# Patient Record
Sex: Female | Born: 1970 | Race: Black or African American | Hispanic: No | Marital: Married | Smoking: Never smoker
Health system: Southern US, Community
[De-identification: ages and names within clinical notes are randomized; demographics above are authoritative.]

## PROBLEM LIST (undated history)

## (undated) DIAGNOSIS — N8003 Adenomyosis of the uterus: Secondary | ICD-10-CM

## (undated) DIAGNOSIS — J309 Allergic rhinitis, unspecified: Secondary | ICD-10-CM

## (undated) DIAGNOSIS — R002 Palpitations: Secondary | ICD-10-CM

## (undated) DIAGNOSIS — Z8619 Personal history of other infectious and parasitic diseases: Secondary | ICD-10-CM

## (undated) DIAGNOSIS — D649 Anemia, unspecified: Secondary | ICD-10-CM

## (undated) DIAGNOSIS — N800 Endometriosis of uterus: Secondary | ICD-10-CM

## (undated) DIAGNOSIS — N809 Endometriosis, unspecified: Secondary | ICD-10-CM

## (undated) DIAGNOSIS — N189 Chronic kidney disease, unspecified: Secondary | ICD-10-CM

## (undated) HISTORY — DX: Personal history of other infectious and parasitic diseases: Z86.19

## (undated) HISTORY — DX: Allergic rhinitis, unspecified: J30.9

## (undated) HISTORY — DX: Anemia, unspecified: D64.9

## (undated) HISTORY — DX: Palpitations: R00.2

## (undated) HISTORY — DX: Adenomyosis of the uterus: N80.03

## (undated) HISTORY — DX: Endometriosis, unspecified: N80.9

## (undated) HISTORY — DX: Chronic kidney disease, unspecified: N18.9

## (undated) HISTORY — DX: Endometriosis of uterus: N80.0

---

## 1990-06-09 DIAGNOSIS — Z8619 Personal history of other infectious and parasitic diseases: Secondary | ICD-10-CM

## 1990-06-09 HISTORY — DX: Personal history of other infectious and parasitic diseases: Z86.19

## 1999-06-10 HISTORY — PX: TUBAL LIGATION: SHX77

## 2002-11-25 ENCOUNTER — Other Ambulatory Visit: Admission: RE | Admit: 2002-11-25 | Discharge: 2002-11-25 | Payer: Self-pay | Admitting: Family Medicine

## 2003-01-07 ENCOUNTER — Emergency Department (HOSPITAL_COMMUNITY): Admission: EM | Admit: 2003-01-07 | Discharge: 2003-01-07 | Payer: Self-pay | Admitting: Emergency Medicine

## 2003-01-07 ENCOUNTER — Encounter: Payer: Self-pay | Admitting: Emergency Medicine

## 2004-04-04 ENCOUNTER — Ambulatory Visit (HOSPITAL_COMMUNITY): Admission: RE | Admit: 2004-04-04 | Discharge: 2004-04-04 | Payer: Self-pay | Admitting: Family Medicine

## 2004-12-17 ENCOUNTER — Other Ambulatory Visit: Admission: RE | Admit: 2004-12-17 | Discharge: 2004-12-17 | Payer: Self-pay | Admitting: Family Medicine

## 2005-07-31 ENCOUNTER — Ambulatory Visit: Payer: Self-pay | Admitting: Hematology & Oncology

## 2005-12-30 ENCOUNTER — Emergency Department (HOSPITAL_COMMUNITY): Admission: EM | Admit: 2005-12-30 | Discharge: 2005-12-30 | Payer: Self-pay | Admitting: Emergency Medicine

## 2006-01-07 ENCOUNTER — Other Ambulatory Visit: Admission: RE | Admit: 2006-01-07 | Discharge: 2006-01-07 | Payer: Self-pay | Admitting: Family Medicine

## 2006-03-24 ENCOUNTER — Encounter: Admission: RE | Admit: 2006-03-24 | Discharge: 2006-03-24 | Payer: Self-pay | Admitting: Family Medicine

## 2006-09-15 ENCOUNTER — Encounter: Admission: RE | Admit: 2006-09-15 | Discharge: 2006-09-15 | Payer: Self-pay | Admitting: Family Medicine

## 2006-12-24 ENCOUNTER — Ambulatory Visit (HOSPITAL_COMMUNITY): Admission: RE | Admit: 2006-12-24 | Discharge: 2006-12-24 | Payer: Self-pay | Admitting: Family Medicine

## 2007-01-05 ENCOUNTER — Ambulatory Visit (HOSPITAL_COMMUNITY): Admission: RE | Admit: 2007-01-05 | Discharge: 2007-01-05 | Payer: Self-pay | Admitting: Family Medicine

## 2007-04-12 ENCOUNTER — Encounter: Admission: RE | Admit: 2007-04-12 | Discharge: 2007-04-12 | Payer: Self-pay | Admitting: Family Medicine

## 2008-11-27 ENCOUNTER — Ambulatory Visit (HOSPITAL_COMMUNITY): Admission: RE | Admit: 2008-11-27 | Discharge: 2008-11-27 | Payer: Self-pay | Admitting: Family Medicine

## 2008-12-07 ENCOUNTER — Ambulatory Visit (HOSPITAL_COMMUNITY): Admission: RE | Admit: 2008-12-07 | Discharge: 2008-12-07 | Payer: Self-pay | Admitting: Family Medicine

## 2008-12-12 ENCOUNTER — Ambulatory Visit (HOSPITAL_COMMUNITY): Admission: RE | Admit: 2008-12-12 | Discharge: 2008-12-12 | Payer: Self-pay | Admitting: Family Medicine

## 2010-02-12 ENCOUNTER — Other Ambulatory Visit: Admission: RE | Admit: 2010-02-12 | Discharge: 2010-02-12 | Payer: Self-pay | Admitting: Family Medicine

## 2010-04-19 ENCOUNTER — Emergency Department (HOSPITAL_COMMUNITY): Admission: EM | Admit: 2010-04-19 | Discharge: 2010-04-19 | Payer: Self-pay | Admitting: Family Medicine

## 2010-08-20 LAB — POCT URINALYSIS DIPSTICK
Bilirubin Urine: NEGATIVE
Glucose, UA: NEGATIVE mg/dL
Nitrite: NEGATIVE
Protein, ur: NEGATIVE mg/dL
Specific Gravity, Urine: 1.025 (ref 1.005–1.030)
Urobilinogen, UA: 1 mg/dL (ref 0.0–1.0)
pH: 6 (ref 5.0–8.0)

## 2010-08-20 LAB — POCT PREGNANCY, URINE: Preg Test, Ur: NEGATIVE

## 2010-11-05 ENCOUNTER — Ambulatory Visit (HOSPITAL_COMMUNITY): Payer: Self-pay

## 2011-12-03 ENCOUNTER — Other Ambulatory Visit: Payer: Self-pay | Admitting: Family Medicine

## 2011-12-03 DIAGNOSIS — Z1231 Encounter for screening mammogram for malignant neoplasm of breast: Secondary | ICD-10-CM

## 2011-12-15 ENCOUNTER — Ambulatory Visit
Admission: RE | Admit: 2011-12-15 | Discharge: 2011-12-15 | Disposition: A | Discharge status: Home / Self Care | Payer: 59 | Source: Ambulatory Visit | Attending: Family Medicine | Admitting: Family Medicine

## 2011-12-15 DIAGNOSIS — Z1231 Encounter for screening mammogram for malignant neoplasm of breast: Secondary | ICD-10-CM

## 2012-03-11 ENCOUNTER — Emergency Department (HOSPITAL_COMMUNITY)
Admission: EM | Admit: 2012-03-11 | Discharge: 2012-03-11 | Disposition: A | Discharge status: Home / Self Care | Payer: 59 | Source: Home / Self Care | Attending: Emergency Medicine | Admitting: Emergency Medicine

## 2012-03-11 ENCOUNTER — Other Ambulatory Visit: Payer: Self-pay

## 2012-03-11 ENCOUNTER — Encounter (HOSPITAL_COMMUNITY): Payer: Self-pay | Admitting: *Deleted

## 2012-03-11 DIAGNOSIS — R0789 Other chest pain: Secondary | ICD-10-CM

## 2012-03-11 MED ORDER — IBUPROFEN 800 MG PO TABS: 800.0000 mg | ORAL_TABLET | Freq: Three times a day (TID) | ORAL | Status: DC

## 2012-03-11 MED ORDER — CYCLOBENZAPRINE HCL 10 MG PO TABS: 10.0000 mg | ORAL_TABLET | Freq: Two times a day (BID) | ORAL | Status: DC | PRN

## 2012-03-11 NOTE — ED Provider Notes (Signed)
Medical screening examination/treatment/procedure(s) were performed by resident-physician practitioner and as supervising physician I was immediately available for consultation/collaboration. I have examined patient and discussed treatment plan. Which included close followup with her primary care Dr.  Raynald Blend, MD 03/11/12 2021

## 2012-03-11 NOTE — ED Provider Notes (Signed)
History     CSN: [id number]  Arrival date & time 03/11/12  1804   First MD Initiated Contact with Patient 03/11/12 1805      Chief Complaint  Patient presents with  . Chest Pain    (Consider location/radiation/quality/duration/timing/severity/associated sxs/prior treatment) Patient is a 41 y.o. female presenting with chest pain. The history is provided by the patient.  Chest Pain The chest pain began 3 - 5 days ago. Chest pain occurs intermittently. The chest pain is unchanged. Associated with: no associations. At its most intense, the pain is at 5/10. The pain is currently at 3/10. The severity of the pain is moderate. The quality of the pain is described as aching. Primary symptoms include palpitations. Pertinent negatives for primary symptoms include no fever, no fatigue, no syncope, no shortness of breath, no cough, no wheezing, no abdominal pain, no nausea, no vomiting, no dizziness and no altered mental status.  The palpitations began 1 to 2 hours ago. The onset of the palpitations was sudden. An episode of palpitations lasts for 5 to 10 minutes. The palpitations occur while anxious. The palpitations did not occur with syncope, dizziness or shortness of breath.  Pertinent negatives for associated symptoms include no claudication, no lower extremity edema, no near-syncope, no numbness, no orthopnea and no weakness. Treatments tried: muscle rub, hot bath. Risk factors include no known risk factors.     History reviewed. No pertinent past medical history.  Past Surgical History  Procedure Date  . Tubal ligation     Family History  Problem Relation Age of Onset  . Diabetes Mother   . Cancer Mother   . Heart failure Mother   . Hypertension Mother   . Parkinsonism Father   . Hypertension Father     History  Substance Use Topics  . Smoking status: Never Smoker   . Smokeless tobacco: Not on file  . Alcohol Use: No    OB History    Grav Para Term Preterm Abortions TAB SAB  Ect Mult Living                  Review of Systems  Constitutional: Negative for fever and fatigue.  HENT: Negative for postnasal drip.   Eyes: Negative for visual disturbance.  Respiratory: Negative for cough, shortness of breath and wheezing.   Cardiovascular: Positive for chest pain and palpitations. Negative for orthopnea, claudication, syncope and near-syncope.  Gastrointestinal: Negative for nausea, vomiting and abdominal pain.  Genitourinary: Negative for flank pain.  Neurological: Negative for dizziness, syncope, weakness and numbness.  Psychiatric/Behavioral: Negative for agitation and altered mental status.    Allergies  Review of patient's allergies indicates no known allergies.  Home Medications   Current Outpatient Rx  Name Route Sig Dispense Refill  . CYCLOBENZAPRINE HCL 10 MG PO TABS Oral Take 1 tablet (10 mg total) by mouth 2 (two) times daily as needed for muscle spasms. 30 tablet 0  . IBUPROFEN 800 MG PO TABS Oral Take 1 tablet (800 mg total) by mouth every 8 (eight) hours. 30 tablet 0    BP 122/82  Pulse 72  Temp 98.3 F (36.8 C) (Oral)  Resp 18  SpO2 100%  LMP 02/20/2012  Physical Exam  Constitutional: She is oriented to person, place, and time. She appears well-developed and well-nourished. No distress.  HENT:  Head: Normocephalic and atraumatic.  Mouth/Throat: Oropharynx is clear and moist.  Eyes: Conjunctivae normal are normal. Pupils are equal, round, and reactive to light.  Neck: Normal  range of motion. Neck supple.  Cardiovascular: Normal rate, regular rhythm, normal heart sounds and intact distal pulses.  Exam reveals no gallop and no friction rub.   No murmur heard. Pulmonary/Chest: Effort normal and breath sounds normal. She has no wheezes. She has no rales. She exhibits tenderness.       Tenderness in upper back produced by palpation over 5th and 6th ribs anteriorly  Abdominal: Soft. She exhibits no distension. There is no tenderness. There  is no rebound and no guarding.  Neurological: She is alert and oriented to person, place, and time.  Skin: Skin is warm and dry.  Psychiatric: She has a normal mood and affect. Her behavior is normal. Judgment and thought content normal.    ED Course  Procedures (including critical care time)  Labs Reviewed - No data to display No results found.  Date: 03/11/2012  Rate: 75  Rhythm: normal sinus rhythm  QRS Axis: normal  Intervals: normal  ST/T Wave abnormalities: normal  Conduction Disutrbances:none  Narrative Interpretation: normal EKG  Old EKG Reviewed: none available    1. Chest pain, musculoskeletal       MDM  Patient with chest pain that is atypical for cardiac causes, no known risk factors, and reproducible on exam.  Will plan to treat with high dose NSAIDs for 1-[redacted] weeks along with flexeral at night.        Brent Bulla, MD 03/11/12 2013

## 2012-03-11 NOTE — ED Notes (Signed)
C/o R ant. chest pain and upper back pain onset Tues.  No injury.  Pain was there when she woke up. Pain comes and goes. Pain got worse while working at her desk @ 1630.  No SOB, nausea, sweating or radiation of pain.  Also has pain in R posterior neck.  No hx. of cardiac problems.  Has tried a muscle rub and soaked in tub yesterday without relief.

## 2012-06-18 ENCOUNTER — Other Ambulatory Visit (HOSPITAL_COMMUNITY)
Admission: RE | Admit: 2012-06-18 | Discharge: 2012-06-18 | Disposition: A | Discharge status: Home / Self Care | Payer: 59 | Source: Ambulatory Visit | Attending: Family Medicine | Admitting: Family Medicine

## 2012-06-18 ENCOUNTER — Other Ambulatory Visit: Payer: Self-pay | Admitting: Family Medicine

## 2012-06-18 DIAGNOSIS — Z124 Encounter for screening for malignant neoplasm of cervix: Secondary | ICD-10-CM | POA: Insufficient documentation

## 2012-06-18 DIAGNOSIS — Z1151 Encounter for screening for human papillomavirus (HPV): Secondary | ICD-10-CM | POA: Insufficient documentation

## 2012-12-02 ENCOUNTER — Other Ambulatory Visit: Payer: Self-pay | Admitting: Family Medicine

## 2012-12-02 DIAGNOSIS — N63 Unspecified lump in unspecified breast: Secondary | ICD-10-CM

## 2012-12-02 DIAGNOSIS — N644 Mastodynia: Secondary | ICD-10-CM

## 2012-12-21 ENCOUNTER — Ambulatory Visit
Admission: RE | Admit: 2012-12-21 | Discharge: 2012-12-21 | Disposition: A | Discharge status: Home / Self Care | Payer: 59 | Source: Ambulatory Visit | Attending: Family Medicine | Admitting: Family Medicine

## 2012-12-21 ENCOUNTER — Other Ambulatory Visit: Payer: Self-pay | Admitting: Family Medicine

## 2012-12-21 DIAGNOSIS — N644 Mastodynia: Secondary | ICD-10-CM

## 2012-12-21 DIAGNOSIS — N63 Unspecified lump in unspecified breast: Secondary | ICD-10-CM

## 2012-12-30 ENCOUNTER — Ambulatory Visit
Admission: RE | Admit: 2012-12-30 | Discharge: 2012-12-30 | Disposition: A | Discharge status: Home / Self Care | Payer: 59 | Source: Ambulatory Visit | Attending: Family Medicine | Admitting: Family Medicine

## 2012-12-30 ENCOUNTER — Ambulatory Visit: Admission: RE | Admit: 2012-12-30 | Payer: 59 | Source: Ambulatory Visit

## 2012-12-30 ENCOUNTER — Ambulatory Visit
Admission: RE | Admit: 2012-12-30 | Discharge: 2012-12-30 | Disposition: A | Discharge status: Home / Self Care | Payer: 59 | Source: Ambulatory Visit

## 2012-12-30 ENCOUNTER — Other Ambulatory Visit: Payer: Self-pay | Admitting: Family Medicine

## 2012-12-30 DIAGNOSIS — N63 Unspecified lump in unspecified breast: Secondary | ICD-10-CM

## 2012-12-30 DIAGNOSIS — N644 Mastodynia: Secondary | ICD-10-CM

## 2013-01-12 ENCOUNTER — Other Ambulatory Visit: Payer: 59

## 2013-12-08 ENCOUNTER — Encounter: Payer: Self-pay | Admitting: *Deleted

## 2013-12-16 ENCOUNTER — Encounter: Payer: Self-pay | Admitting: *Deleted

## 2014-02-07 ENCOUNTER — Encounter: Payer: Self-pay | Admitting: *Deleted

## 2014-02-08 ENCOUNTER — Ambulatory Visit (INDEPENDENT_AMBULATORY_CARE_PROVIDER_SITE_OTHER): Payer: 59 | Admitting: Neurology

## 2014-02-08 ENCOUNTER — Encounter: Payer: Self-pay | Admitting: Neurology

## 2014-02-08 VITALS — BP 124/81 | HR 79 | Ht 66.0 in | Wt 106.6 lb

## 2014-02-08 DIAGNOSIS — R202 Paresthesia of skin: Secondary | ICD-10-CM

## 2014-02-08 DIAGNOSIS — G589 Mononeuropathy, unspecified: Secondary | ICD-10-CM

## 2014-02-08 DIAGNOSIS — R209 Unspecified disturbances of skin sensation: Secondary | ICD-10-CM | POA: Diagnosis not present

## 2014-02-08 DIAGNOSIS — G629 Polyneuropathy, unspecified: Secondary | ICD-10-CM

## 2014-02-08 NOTE — Patient Instructions (Signed)
Overall you are doing fairly well but I do want to suggest a few things today:   Remember to drink plenty of fluid, eat healthy meals and do not skip any meals. Try to eat protein with a every meal and eat a healthy snack such as fruit or nuts in between meals. Try to keep a regular sleep-wake schedule and try to exercise daily, particularly in the form of walking, 20-30 minutes a day, if you can.   As far as diagnostic testing: We are going to evaluate you with an EMG/NCS, bloodwork, and MRIof the brain. Please proceed to lab today.   I would like to see you back in 3 months, sooner if we need to. Please call us with any interim questions, concerns, problems, updates or refill requests.   My clinical assistant and will answer any of your questions and relay your messages to me and also relay most of my messages to you.   Our phone number is [phone]. We also have an after hours call service for urgent matters and there is a physician on-call for urgent questions. For any emergencies you know to call 911 or go to the nearest emergency room

## 2014-02-08 NOTE — Progress Notes (Signed)
[facility]    Provider:  Dr Jaynee Eagles Referring Provider: Cari Caraway, MD Primary Care Physician:  Cari Caraway, MD  CC:  paresthesias  HPI:  Sandra Kim is a 43 y.o. female here as a referral from Dr. Addison Lank for paresthesia. Paresthesias started last Wednesday. Was feeling numbness and tingling in the feet. She put socks on. 10-15 minutes later was in the whole foot to the ankle. Then the sensations came all the way up the legs to above the knees. Symmetrically. Was able to stand, didn't feel weak. The sensation are odd, not painful or hurting. Like pins and needle. It has improved since then but she still feels it especially when she sits down. Now worse at night. Nothing makes it better or worse. Started at home, she was getting ready for bed. No recent illnesses, no trauma, no inciting factors. The symptoms are constant. No other focal neuro problems. No neurodegenerative or neuromuscular family history.  Reviewed notes, labs and imaging from outside physicians, which showed B12 and folate normal, CBC, CMP, ferritin,  TSH, sed, ANA comprehensive panel all normal. Cbc/cmp unremarkable,   Review of Systems: Patient complains of symptoms per HPI as well as the following symptoms numbness, restless lega, feeling cold . Pertinent negatives per HPI. Otherwise out of a complete 14 system review, and all other reviewed systems are negative.   History   Social History  . Marital Status: Married    Spouse Name: N/A    Number of Children: 3  . Years of Education: 12   Occupational History  .  [employer]   Social History Main Topics  . Smoking status: Never Smoker   . Smokeless tobacco: Never Used  . Alcohol Use: No  . Drug Use: No  . Sexual Activity: Not on file   Other Topics Concern  . Not on file   Social History Narrative   Patient is married with 3 children.   Patient is right handed.   Patient has a high school education.   Patient drinks 2 cups  daily.    Family History  Problem Relation Age of Onset  . Diabetes Mother   . Breast cancer Mother   . Heart failure Mother   . Hypertension Mother   . Parkinsonism Father   . Hypertension Father   . Lung cancer Brother   . Diabetes Sister   . Hypertension Sister   . CAD Sister   . Lung cancer Brother 66    youngest brother  . Anemia      Past Medical History  Diagnosis Date  . Palpitations   . 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 Adenomyosis     pelvic pain  . Allergic rhinitis   . H/O chlamydia infection 1992    during 1st pregnancy  . Kidney stone 12/2008    Left sid 2-3 mm  . Palpitations     with sinus tachycardia    Past Surgical History  Procedure Laterality Date  . Tubal ligation Bilateral 2001    Current Outpatient Prescriptions  Medication Sig Dispense Refill  . cyclobenzaprine (FLEXERIL) 10 MG tablet Take 1 tablet (10 mg total) by mouth 2 (two) times daily as needed for muscle spasms.  30 tablet  0  . ibuprofen (ADVIL) 800 MG tablet Take 1 tablet (800 mg total) by mouth every 8 (eight) hours.  30 tablet  0  . vitamin C (ASCORBIC ACID) 250 MG tablet Take 250 mg by mouth  daily. Taking 2 tablets once a day       No current facility-administered medications for this visit.    Allergies as of 02/08/2014 - Review Complete 02/08/2014  Allergen Reaction Noted  . Penicillins      Vitals: BP 124/81  Pulse 79  Ht 5\' 6"  (1.676 m)  Wt 106 lb 9.6 oz (48.353 kg)  BMI 17.21 kg/m2 Last Weight:  Wt Readings from Last 1 Encounters:  02/08/14 106 lb 9.6 oz (48.353 kg)   Last Height:   Ht Readings from Last 1 Encounters:  02/08/14 5\' 6"  (1.676 m)    Physical exam: Exam: Gen: NAD, conversant Eyes: anicteric sclerae, moist conjunctivae HENT: Atraumatic, oropharynx clear Neck: Trachea midline; supple,  Lungs: CTA, no wheezing, rales, rhonic                          CV: RRR, no MRG Abdomen: Soft, non-tender;  Extremities: No peripheral edema  Skin: Normal  temperature, no rash,  Psych: Appropriate affect, pleasant  Neuro: Detailed Neurologic Exam  Speech:    Speech is normal; fluent and spontaneous with normal comprehension.  Cognition:    The patient is oriented to person, place, and time; memory intact; language fluent; normal attention, concentration, and fund of knowledge.  Cranial Nerves:    The pupils are equal, round, and reactive to light. The fundi are normal and spontaneous venous pulsations are present. Visual fields are full to finger confrontation. Extraocular movements are intact. Trigeminal sensation is intact and the muscles of mastication are normal. The face is symmetric. The palate elevates in the midline. Voice is normal. Shoulder shrug is normal. The tongue has normal motion without fasciculations.   Coordination:    Normal finger to nose and heel to shin. Normal rapid alternating movements.   Gait:    Heel-toe and tandem gait are normal.   Motor Observation:    No asymmetry, no atrophy, and no involuntary movements noted. Tone:    Normal muscle tone.    Posture:    Posture is normal. normal erect    Strength:    Strength is V/V in the upper and lower limbs.       Vibratory Sensation:    Normal vibratory sensation in upper and lower extremities.   Light Touch:    Normal light touch sensation in upper and lower extremities.     Proprioception:    Normal proprioception in upper and lower extremities.  Pin Prick:    Normal sensation to pinprick in upper and lower extremities.    Temperature:    Normal temperature sensation in upper and lower extremities.  Reflex Exam:  DTR's:    Deep tendon reflexes in the upper and lower extremities are normal bilaterally.   Toes:    The toes are downgoing bilaterally.   Clonus:    Clonus is absent.      Assessment/Plan:  43 year old female with no significant PMHx who is here for rapidly ascending paresthesias. Neuro exam is normal. 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 Will order a neuropathy serum screen for causes of polyneuropathy and an EMG/NCS. Discussed that GBS can be severe and life threatening and to proceed to the emergency room if the paresthesias continue to ascend or she experiences any respiratory difficulties or progressive weakness. Will also order an MRI of the brain with contrast and MS protocol as unusual neurologic deficits in a young and healthy female should prompt  evaluation for MS and other intracranial processes.   [name], MD  [facility] [address] [place], [place] [address]  Phone [phone] Fax [phone]

## 2014-02-10 ENCOUNTER — Telehealth: Payer: Self-pay

## 2014-02-10 NOTE — Telephone Encounter (Signed)
Called and left patient a message normal labs. 

## 2014-02-10 NOTE — Telephone Encounter (Signed)
Message copied by Francetta Found on Fri Feb 10, 2014  4:23 PM ------      Message from: AHERN, ANTONIA B      Created: Fri Feb 10, 2014  1:12 PM       Please call patient and let her know that her labs were normal. thanks ------

## 2014-02-10 NOTE — Progress Notes (Signed)
Quick Note:  Called and left patient a message normal labs. ______

## 2014-02-11 ENCOUNTER — Encounter: Payer: Self-pay | Admitting: Neurology

## 2014-02-11 DIAGNOSIS — R202 Paresthesia of skin: Secondary | ICD-10-CM | POA: Insufficient documentation

## 2014-02-13 LAB — PAN-ANCA
ANCA Proteinase 3: <3.5 U/mL (ref 0.0–3.5)
Atypical pANCA: <1:20 {titer}
C-ANCA: <1:20 {titer}

## 2014-02-13 LAB — HEMOGLOBIN A1C
ESTIMATED AVERAGE GLUCOSE: 114 mg/dL
HEMOGLOBIN A1C: 5.6 % (ref 4.8–5.6)

## 2014-02-13 LAB — IFE AND PE, SERUM
ALPHA 1: 0.2 g/dL (ref 0.1–0.4)
ALPHA2 GLOB SERPL ELPH-MCNC: 0.6 g/dL (ref 0.4–1.2)
Albumin SerPl Elph-Mcnc: 3.9 g/dL (ref 3.2–5.6)
Albumin/Glob SerPl: 1.4 (ref 0.7–2.0)
B-Globulin SerPl Elph-Mcnc: 0.8 g/dL (ref 0.6–1.3)
GLOBULIN, TOTAL: 2.9 g/dL (ref 2.0–4.5)
Gamma Glob SerPl Elph-Mcnc: 1.2 g/dL (ref 0.5–1.6)
IGA/IMMUNOGLOBULIN A, SERUM: 217 mg/dL (ref 91–414)
IGM (IMMUNOGLOBULIN M), SRM: 171 mg/dL (ref 40–230)
IgG (Immunoglobin G), Serum: 1131 mg/dL (ref 700–1600)
TOTAL PROTEIN: 6.8 g/dL (ref 6.0–8.5)

## 2014-02-13 LAB — ANGIOTENSIN CONVERTING ENZYME: Angio Convert Enzyme: 25 U/L (ref 14–82)

## 2014-02-13 LAB — LYME, TOTAL AB TEST/REFLEX: Lyme IgG/IgM Ab: <0.91 {ISR} (ref 0.00–0.90)

## 2014-02-13 LAB — HIV ANTIBODY (ROUTINE TESTING W REFLEX): HIV 1/HIV 2 AB: NONREACTIVE

## 2014-02-13 LAB — RPR: RPR: NONREACTIVE

## 2014-02-13 LAB — RHEUMATOID FACTOR: Rhuematoid fact SerPl-aCnc: 7.6 IU/mL (ref 0.0–13.9)

## 2014-02-15 ENCOUNTER — Ambulatory Visit (INDEPENDENT_AMBULATORY_CARE_PROVIDER_SITE_OTHER): Payer: 59

## 2014-02-15 ENCOUNTER — Ambulatory Visit (INDEPENDENT_AMBULATORY_CARE_PROVIDER_SITE_OTHER): Payer: 59 | Admitting: Neurology

## 2014-02-15 ENCOUNTER — Encounter (INDEPENDENT_AMBULATORY_CARE_PROVIDER_SITE_OTHER): Payer: Self-pay | Admitting: Radiology

## 2014-02-15 DIAGNOSIS — Z0289 Encounter for other administrative examinations: Secondary | ICD-10-CM

## 2014-02-15 DIAGNOSIS — R202 Paresthesia of skin: Secondary | ICD-10-CM

## 2014-02-15 DIAGNOSIS — R209 Unspecified disturbances of skin sensation: Secondary | ICD-10-CM

## 2014-02-15 DIAGNOSIS — G629 Polyneuropathy, unspecified: Secondary | ICD-10-CM

## 2014-02-15 MED ORDER — GADOPENTETATE DIMEGLUMINE 469.01 MG/ML IV SOLN: 10.0000 mL | Freq: Once | INTRAVENOUS | Status: AC | PRN

## 2014-02-15 NOTE — Progress Notes (Signed)
  [facility]  Provider: Dr Jaynee Eagles  Referring Provider: Cari Caraway, MD  Primary Care Physician: Cari Caraway, MD  CC: paresthesias  History: Sandra Kim is a 43 y.o. female here as a referral from Dr. Addison Lank for rapidly ascending paresthesias. 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   Paresthesias started approx 2 weeks ago. Was feeling numbness and tingling in the feet. She put socks on. 10-15 minutes later was in the whole foot to the ankle. Then the sensations came all the way up the legs to above the knees. Symmetrically. Was able to stand, didn't feel weak. The sensation are odd, not painful or hurting. Like pins and needle. It has improved since then but she still feels it especially when she sits down. No recent illnesses, no trauma, no inciting factors. The symptoms are constant. No other focal neuro problems. No neurodegenerative or neuromuscular family history. Focused lower extremity exam with 5/5 strength, 2+ reflexes and intact sensation.  Summary: Nerve conduction studies were performed on the bilateral lower extremities.  Bilateral Peroneal and Tibial motor conductions were within normal limits with normal F Wave latencies.  Bilateral Sural and Peroneal sensory conductions were within normal limits H Reflex studies showed borderline peak latency difference of 1.34ms (N<1.5) with relative left delay.      EMG needle study was performed on selected left lower extremity muscles. The Vastus Medialis, Anterior Tibialis, Medial Gastrocnemius, Extensor Hallucis Longus and Abductor Hallucis muscles were within normal limits.  Conclusion:  This is essentially a normal study.  Given the borderline H reflex peak latency difference, cannot rule out left S1 radiculopathy. However, patient appears asymptomatic. Clinical correlation suggestion. No suggestion of demyelinating disease/GBS.    [name], MD  [facility] [address] [place], [place] [address]  Phone [phone] Fax [phone]

## 2014-02-20 ENCOUNTER — Telehealth: Payer: Self-pay | Admitting: *Deleted

## 2014-02-20 NOTE — Progress Notes (Signed)
Quick Note:  Called patient, left voice message of normal labs, call the office back with any questions or concerns. ______

## 2014-02-20 NOTE — Telephone Encounter (Signed)
Called patient, left voice message of normal labs, call the office back with any questions or concerns.

## 2014-02-21 NOTE — Progress Notes (Signed)
Quick Note:  Left voice message of normal MRI of the brain, call the office back with any questions or concerns. ______

## 2014-03-01 ENCOUNTER — Telehealth: Payer: Self-pay | Admitting: Neurology

## 2014-03-01 NOTE — Telephone Encounter (Signed)
See phone note 9/23 in regards to flu vaccine and GBS which was diagnosis on 02/15/14.

## 2014-03-01 NOTE — Telephone Encounter (Signed)
That is fine. Would you write one one up please? We can discuss tomorrow.

## 2014-03-01 NOTE — Telephone Encounter (Signed)
Patient calling to state that at her job, getting the flu shot is mandatory, but states that her condition makes her exempt from the flu shot, patient is requesting a note saying this. Please return call and advise.

## 2014-03-02 ENCOUNTER — Encounter: Payer: Self-pay | Admitting: *Deleted

## 2014-03-02 NOTE — Telephone Encounter (Signed)
Letter written patient called she will pick up.

## 2014-03-02 NOTE — Telephone Encounter (Signed)
Letter written patient contacted.

## 2014-03-02 NOTE — Telephone Encounter (Signed)
Elisha Headland - would you write a quick letter for this patient? 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 The CDC advises caution with respect to flu shots in those diagnosed within the last 6 weeks. Would prefer patient to wait 8 weeks after symptoms have resolved before flu vaccine.

## 2014-05-10 ENCOUNTER — Encounter: Payer: Self-pay | Admitting: Neurology

## 2014-05-10 ENCOUNTER — Ambulatory Visit (INDEPENDENT_AMBULATORY_CARE_PROVIDER_SITE_OTHER): Payer: 59 | Admitting: Neurology

## 2014-05-10 VITALS — BP 112/73 | HR 81 | Ht 66.0 in | Wt 109.0 lb

## 2014-05-10 DIAGNOSIS — G610 Guillain-Barre syndrome: Secondary | ICD-10-CM | POA: Diagnosis not present

## 2014-05-10 NOTE — Progress Notes (Signed)
[facility]    Provider: Dr Jaynee Eagles Referring Provider: Cari Caraway, MD Primary Care Physician: Cari Caraway, MD  CC: paresthesias   Patient was seen in September for rapidly ascending paresthesias concerning for GBS. Sinc ethen the sensations have improved. Patient's symptoms are better. Sometimes she feels tingling in the toes and feet and in the legs. That is happening occasionally, not often. But much better. No other symptoms. She notices it more when she is at rest or laying down. Not impacting her life. Not painful. No recent illnesses. No weakness. No other focal neurologic deficits.   EMG/NCS on 02/15/2014 was essentially normal. ACE, RF, HIV, IFE, ANCA, Lyme, RPR, HgbA1c all normal. MRi unremarkable.B12 and folate normal, CBC, CMP, ferritin, TSH, sed, ANA comprehensive panel all normal. Cbc/cmp unremarkable,   Review of Systems: Patient complains of symptoms per HPI as well as the following symptoms: No SOB, No CP. Pertinent negatives per HPI. All others negative.   Previous appointment 02/08/2014:   HPI: Sandra Kim is a 43 y.o. female here as a referral from Dr. Addison Lank for paresthesia. Paresthesias started last Wednesday. Was feeling numbness and tingling in the feet. She put socks on. 10-15 minutes later was in the whole foot to the ankle. Then the sensations came all the way up the legs to above the knees. Symmetrically. Was able to stand, didn't feel weak. The sensation are odd, not painful or hurting. Like pins and needle. It has improved since then but she still feels it especially when she sits down. Now worse at night. Nothing makes it better or worse. Started at home, she was getting ready for bed. No recent illnesses, no trauma, no inciting factors. The symptoms are constant. No other focal neuro problems. No neurodegenerative or neuromuscular family history.  Reviewed notes, labs and imaging from outside physicians, which showed B12 and  folate normal, CBC, CMP, ferritin, TSH, sed, ANA comprehensive panel all normal. Cbc/cmp unremarkable,      History   Social History  . Marital Status: Married    Spouse Name: [name]     Number of Children: 3  . Years of Education: 12   Occupational History  .  [employer]   Social History Main Topics  . Smoking status: Never Smoker   . Smokeless tobacco: Never Used  . Alcohol Use: No  . Drug Use: No  . Sexual Activity: Not on file   Other Topics Concern  . Not on file   Social History Narrative   Patient is married with 3 children.   Patient is right handed.   Patient has a high school education.   Patient drinks 2 cups daily.    Family History  Problem Relation Age of Onset  . Diabetes Mother   . Breast cancer Mother   . Heart failure Mother   . Hypertension Mother   . Parkinsonism Father   . Hypertension Father   . Lung cancer Brother   . Diabetes Sister   . Hypertension Sister   . CAD Sister   . Lung cancer Brother 33    youngest brother  . Anemia      Past Medical History  Diagnosis Date  . Palpitations   . 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 Adenomyosis     pelvic pain  . Allergic rhinitis   . H/O chlamydia infection 1992    during 1st pregnancy  . Kidney stone 12/2008    Left sid 2-3 mm  .  Palpitations     with sinus tachycardia    Past Surgical History  Procedure Laterality Date  . Tubal ligation Bilateral 2001    Current Outpatient Prescriptions  Medication Sig Dispense Refill  . ibuprofen (ADVIL) 800 MG tablet Take 1 tablet (800 mg total) by mouth every 8 (eight) hours. 30 tablet 0  . vitamin C (ASCORBIC ACID) 250 MG tablet Take 250 mg by mouth daily. Taking 2 tablets once a day     No current facility-administered medications for this visit.    Allergies as of 05/10/2014 - Review Complete 02/11/2014  Allergen Reaction Noted  . Penicillins      Vitals: BP 112/73 mmHg  Pulse 81  Ht 5\' 6"  (1.676 m)  Wt 109 lb (49.442 kg)  BMI  17.60 kg/m2 Last Weight:  Wt Readings from Last 1 Encounters:  05/10/14 109 lb (49.442 kg)   Last Height:   Ht Readings from Last 1 Encounters:  05/10/14 5\' 6"  (1.676 m)   Physical exam: Exam: Gen: NAD, conversant, well nourised, well groomed                     CV: RRR, no MRG. No Carotid Bruits. No peripheral edema, warm, nontender Eyes: Conjunctivae clear without exudates or hemorrhage  Neuro: Detailed Neurologic Exam  Speech:    Speech is normal; fluent and spontaneous with normal comprehension.  Cognition:    The patient is oriented to person, place, and time;     recent and remote memory intact;     language fluent;     normal attention, concentration,     fund of knowledge Cranial Nerves:    The pupils are equal, round, and reactive to light. The fundi are normal and spontaneous venous pulsations are present. Visual fields are full to finger confrontation. Extraocular movements are intact. Trigeminal sensation is intact and the muscles of mastication are normal. The face is symmetric. The palate elevates in the midline. Voice is normal. Shoulder shrug is normal. The tongue has normal motion without fasciculations.   Coordination:    Normal finger to nose and heel to shin. Normal rapid alternating movements.   Gait:    Heel-toe and tandem gait are normal.   Motor Observation:    No asymmetry, no atrophy, and no involuntary movements noted. Tone:    Normal muscle tone.    Posture:    Posture is normal. normal erect    Strength:    Strength is V/V in the upper and lower limbs.      Sensation: intact     Reflex Exam:  DTR's:    Deep tendon reflexes in the upper and lower extremities are normal bilaterally.   Toes:    The toes are downgoing bilaterally.   Clonus:    Clonus is absent.      Assessment/Plan: 43 year old female with no significant PMHx who is here for follow up of rapidly ascending paresthesias. Neuro exam is normal. 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 Neuropathy screen and EMG/NCS did not show etiology. MRi unremarkable. Her symptoms are almost gone. Follow up in 6 months.        [name], MD  [facility] [address] [place], [place] [address]  Phone [phone] Fax [phone]

## 2014-11-09 ENCOUNTER — Ambulatory Visit (INDEPENDENT_AMBULATORY_CARE_PROVIDER_SITE_OTHER): Payer: 59 | Admitting: Neurology

## 2014-11-09 ENCOUNTER — Encounter: Payer: Self-pay | Admitting: Neurology

## 2014-11-09 VITALS — BP 114/75 | HR 75 | Ht 66.0 in | Wt 105.0 lb

## 2014-11-09 DIAGNOSIS — R202 Paresthesia of skin: Secondary | ICD-10-CM

## 2014-11-09 DIAGNOSIS — R200 Anesthesia of skin: Secondary | ICD-10-CM | POA: Diagnosis not present

## 2014-11-09 NOTE — Progress Notes (Signed)
[facility]    Provider: Dr Jaynee Eagles Referring Provider: Cari Caraway, MD Primary Care Physician: Cari Caraway, MD  CC: paresthesias  Interval update 11/10/2014: Patient returns today and all symptoms have improved, she denies any paresthesias or numbness in her legs and hands. No weakness, no shortness of breath or chest pain, no focal neurologic deficits.  IMPRESSION:  Equivocal MRI brain (with and without) demonstrating: 1. There is a punctate right parietal focus of non-specific gliosis (series 7 image 16).  2. Small right pontine enhancing lesion, with (46mm; series 11 image 25; series 12 image 10). This is not apparent on T2 or T2FLAIR views. May represent developmental venous anomaly or capillary telangiectasia.   EMG nerve conduction study was normal  Normal labs: Ace, rheumatoid factor, HIV, IFE, ankle, Lyme, hemoglobin A1c, RPR    HPI: Sandra Kim is a 44 y.o. female here as a referral from Dr. Addison Lank for paresthesia. Paresthesias started last Wednesday. Was feeling numbness and tingling in the feet. She put socks on. 10-15 minutes later was in the whole foot to the ankle. Then the sensations came all the way up the legs to above the knees. Symmetrically. Was able to stand, didn't feel weak. The sensation are odd, not painful or hurting. Like pins and needle. It has improved since then but she still feels it especially when she sits down. Now worse at night. Nothing makes it better or worse. Started at home, she was getting ready for bed. No recent illnesses, no trauma, no inciting factors. The symptoms are constant. No other focal neuro problems. No neurodegenerative or neuromuscular family history.  Reviewed notes, labs and imaging from outside physicians, which showed B12 and folate normal, CBC, CMP, ferritin, TSH, sed, ANA comprehensive panel all normal. Cbc/cmp unremarkable  Review of Systems: Patient complains of symptoms per HPI as well as the  following symptoms: No chest pain, no shortness of breath. Pertinent negatives per HPI. All others negative.   History   Social History  . Marital Status: Married    Spouse Name: Sandra Kim   . Number of Children: 3  . Years of Education: 12   Occupational History  .  [employer]   Social History Main Topics  . Smoking status: Never Smoker   . Smokeless tobacco: Never Used  . Alcohol Use: No  . Drug Use: No  . Sexual Activity: Not on file   Other Topics Concern  . Not on file   Social History Narrative   Patient is married with 3 children.   Patient is right handed.   Patient has a high school education.   Patient drinks 2 cups daily.    Family History  Problem Relation Age of Onset  . Diabetes Mother   . Breast cancer Mother   . Heart failure Mother   . Hypertension Mother   . Parkinsonism Father   . Hypertension Father   . Lung cancer Brother   . Diabetes Sister   . Hypertension Sister   . CAD Sister   . Lung cancer Brother 21    youngest brother  . Anemia      Past Medical History  Diagnosis Date  . Palpitations   . 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 Adenomyosis     pelvic pain  . Allergic rhinitis   . H/O chlamydia infection 1992    during 1st pregnancy  . Palpitations     with sinus tachycardia  . Chronic kidney disease  Past Surgical History  Procedure Laterality Date  . Tubal ligation Bilateral 2001    Current Outpatient Prescriptions  Medication Sig Dispense Refill  . vitamin C (ASCORBIC ACID) 250 MG tablet Take 250 mg by mouth as needed. Taking 2 tablets once a day    . ibuprofen (ADVIL) 800 MG tablet Take 1 tablet (800 mg total) by mouth every 8 (eight) hours. (Patient not taking: Reported on 11/09/2014) 30 tablet 0   No current facility-administered medications for this visit.    Allergies as of 11/09/2014 - Review Complete 05/10/2014  Allergen Reaction Noted  . Penicillins      Vitals: BP 114/75 mmHg  Pulse 75  Ht 5\' 6"  (1.676 m)   Wt 105 lb (47.628 kg)  BMI 16.96 kg/m2 Last Weight:  Wt Readings from Last 1 Encounters:  11/09/14 105 lb (47.628 kg)   Last Height:   Ht Readings from Last 1 Encounters:  11/09/14 5\' 6"  (1.676 m)     Neuro: Focused Neurologic Exam  Speech:    Speech is normal; fluent and spontaneous with normal comprehension.  Cognition:    The patient is oriented to person, place, and time;     recent and remote memory intact;     language fluent;     normal attention, concentration,     fund of knowledge Cranial Nerves:    The pupils are equal, round, and reactive to light.  Visual fields are full to finger confrontation. Extraocular movements are intact. Trigeminal sensation is intact and the muscles of mastication are normal. The face is symmetric. The palate elevates in the midline. Hearing intact. Voice is normal. Shoulder shrug is normal. The tongue has normal motion without fasciculations.     Strength:    Strength is V/V in the upper and lower limbs.      Sensation: intact to LT, pin prick throughout      Assessment/Plan: 44 year old female with no significant PMHx who was originally evaluated for rapidly ascending paresthesias. Neuro exam is normal. 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 Labs, EMG nerve conduction study and MRI of the brain were all unremarkable. Symptoms have resolved. Return to clinic as needed. Discussed autoimmune neuropathy, likely would not recur.   [name], MD  [facility] [address] [place], [place] [address]  Phone [phone] Fax [phone]  A total of 15 minutes was spent face-to-face with this patient. Over half this time was spent on counseling patient on the GBS diagnosis and different diagnostic and therapeutic options available.

## 2015-01-30 ENCOUNTER — Other Ambulatory Visit: Payer: Self-pay

## 2015-01-30 DIAGNOSIS — Z1231 Encounter for screening mammogram for malignant neoplasm of breast: Secondary | ICD-10-CM

## 2015-02-02 ENCOUNTER — Ambulatory Visit
Admission: RE | Admit: 2015-02-02 | Discharge: 2015-02-02 | Disposition: A | Discharge status: Home / Self Care | Payer: 59 | Source: Ambulatory Visit

## 2015-02-02 DIAGNOSIS — Z1231 Encounter for screening mammogram for malignant neoplasm of breast: Secondary | ICD-10-CM

## 2015-02-28 ENCOUNTER — Telehealth: Payer: Self-pay | Admitting: Neurology

## 2015-02-28 ENCOUNTER — Encounter: Payer: Self-pay | Admitting: Neurology

## 2015-02-28 NOTE — Telephone Encounter (Signed)
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 The patient states she has already been exempted from taking the flu vaccine. Please call the patient and advise. The patient can pick up the letter. Thank you.

## 2015-02-28 NOTE — Telephone Encounter (Signed)
Sandra Kim - Letter on the printer thanks! Please have me sign it and send it to her.

## 2015-03-01 NOTE — Telephone Encounter (Signed)
Left detailed VM to let pt know letter is ready to be picked up at our office. Gave [facility] phone number and office hours if she has further questions.

## 2015-03-02 NOTE — Telephone Encounter (Signed)
Patient called back about letter she picked up regarding TDAP and Guillain-Bare syndrome. Patient has questions and would like a call back.

## 2015-03-05 NOTE — Telephone Encounter (Signed)
Left message on her cell phone. There is always a risk for repeat GBS after vaccine but it is a risk vs benefit. The seriousness of contracting tetanus for example makes the benefit greater than the risk. Asked patient to call back if she wanted to talk about it, more than happy to discuss. Will try her back later a swell.

## 2015-03-05 NOTE — Telephone Encounter (Signed)
Spoke to patient, she understands the benefits vs risks of getting the TDaP She elects to get the vaccine. I advised her if she has any symptoms after her shot, to call me asap or proceed to the ED.

## 2015-06-25 DIAGNOSIS — Z0000 Encounter for general adult medical examination without abnormal findings: Secondary | ICD-10-CM | POA: Diagnosis not present

## 2015-06-25 DIAGNOSIS — Z8669 Personal history of other diseases of the nervous system and sense organs: Secondary | ICD-10-CM | POA: Diagnosis not present

## 2015-08-27 DIAGNOSIS — R1032 Left lower quadrant pain: Secondary | ICD-10-CM | POA: Diagnosis not present

## 2015-08-27 DIAGNOSIS — N760 Acute vaginitis: Secondary | ICD-10-CM | POA: Diagnosis not present

## 2015-09-06 DIAGNOSIS — H5213 Myopia, bilateral: Secondary | ICD-10-CM | POA: Diagnosis not present

## 2015-09-15 ENCOUNTER — Encounter (HOSPITAL_COMMUNITY): Payer: Self-pay | Admitting: *Deleted

## 2015-09-15 ENCOUNTER — Ambulatory Visit (HOSPITAL_COMMUNITY)
Admission: EM | Admit: 2015-09-15 | Discharge: 2015-09-15 | Disposition: A | Discharge status: Home / Self Care | Payer: 59 | Attending: Family Medicine | Admitting: Family Medicine

## 2015-09-15 DIAGNOSIS — M546 Pain in thoracic spine: Secondary | ICD-10-CM

## 2015-09-15 MED ORDER — DICLOFENAC POTASSIUM 50 MG PO TABS: 50.0000 mg | ORAL_TABLET | Freq: Three times a day (TID) | ORAL | Status: DC

## 2015-09-15 MED ORDER — CYCLOBENZAPRINE HCL 5 MG PO TABS: 5.0000 mg | ORAL_TABLET | Freq: Three times a day (TID) | ORAL | Status: DC | PRN

## 2015-09-15 NOTE — ED Notes (Signed)
Pt  Reports  Pain  r  Upper  Back   With  Onset  5  Days  Ago    Pt  denys  Any  specefic  Injury  Ambulates   Well  With a  Slow  Steady  Fluid  Gait   Pt  Reports  The  Pain is  Worse  On movement  At  Times  And  At  Others  The  Pain    is  Constant   denys  Any  Urinary  Symptome

## 2015-09-15 NOTE — ED Provider Notes (Signed)
CSN: [id number]     Arrival date & time 09/15/15  1652 History   First MD Initiated Contact with Patient 09/15/15 1713     Chief Complaint  Patient presents with  . Back Pain   (Consider location/radiation/quality/duration/timing/severity/associated sxs/prior Treatment) Patient is a 45 y.o. female presenting with back pain. The history is provided by the patient and the spouse.  Back Pain Location:  Thoracic spine Quality:  Burning Radiates to:  Does not radiate Pain severity:  Mild Onset quality:  Sudden Duration:  5 days Progression:  Waxing and waning Chronicity:  New Context comment:  Turning getting out of bed Associated symptoms: no chest pain, no leg pain and no numbness     Past Medical History  Diagnosis Date  . Palpitations   . 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 Adenomyosis     pelvic pain  . Allergic rhinitis   . H/O chlamydia infection 1992    during 1st pregnancy  . Palpitations     with sinus tachycardia  . Chronic kidney disease    Past Surgical History  Procedure Laterality Date  . Tubal ligation Bilateral 2001   Family History  Problem Relation Age of Onset  . Diabetes Mother   . Breast cancer Mother   . Heart failure Mother   . Hypertension Mother   . Parkinsonism Father   . Hypertension Father   . Lung cancer Brother   . Diabetes Sister   . Hypertension Sister   . CAD Sister   . Lung cancer Brother 76    youngest brother  . Anemia     Social History  Substance Use Topics  . Smoking status: Never Smoker   . Smokeless tobacco: Never Used  . Alcohol Use: No   OB History    No data available     Review of Systems  Constitutional: Negative.   Respiratory: Negative for cough and shortness of breath.   Cardiovascular: Negative for chest pain and leg swelling.  Musculoskeletal: Positive for back pain. Negative for gait problem.  Neurological: Negative for numbness.  All other systems reviewed and are negative.   Allergies   Penicillins  Home Medications   Prior to Admission medications   Medication Sig Start Date End Date Taking? Authorizing Provider  ibuprofen (ADVIL) 800 MG tablet Take 1 tablet (800 mg total) by mouth every 8 (eight) hours. Patient not taking: Reported on 11/09/2014 03/11/12   Vivi Ferns, MD  vitamin C (ASCORBIC ACID) 250 MG tablet Take 250 mg by mouth as needed. Taking 2 tablets once a day    Historical Provider, MD   Meds Ordered and Administered this Visit  Medications - No data to display  BP 144/84 mmHg  Pulse 76  Temp(Src) 97.8 F (36.6 C) (Oral)  Resp 16  SpO2 100%  LMP 09/09/2015 No data found.   Physical Exam  Constitutional: She is oriented to person, place, and time. She appears well-developed and well-nourished. No distress.  Neck: Normal range of motion. Neck supple.  Cardiovascular: Regular rhythm, normal heart sounds and intact distal pulses.   Pulmonary/Chest: Effort normal and breath sounds normal. She exhibits tenderness.  Abdominal: Soft. Bowel sounds are normal.  Musculoskeletal:       Thoracic back: She exhibits decreased range of motion, tenderness, pain and spasm. She exhibits no swelling and normal pulse.       Back:  Lymphadenopathy:    She has no cervical adenopathy.  Neurological: She is  alert and oriented to person, place, and time.  Skin: Skin is warm and dry.  Nursing note and vitals reviewed.   ED Course  Procedures (including critical care time)  Labs Review Labs Reviewed - No data to display  Imaging Review No results found.   Visual Acuity Review  Right Eye Distance:   Left Eye Distance:   Bilateral Distance:    Right Eye Near:   Left Eye Near:    Bilateral Near:         MDM  No diagnosis found.  Meds ordered this encounter  Medications  . cyclobenzaprine (FLEXERIL) 5 MG tablet    Sig: Take 1 tablet (5 mg total) by mouth 3 (three) times daily as needed for muscle spasms.    Dispense:  30 tablet    Refill:  0  .  diclofenac (CATAFLAM) 50 MG tablet    Sig: Take 1 tablet (50 mg total) by mouth 3 (three) times daily. For back pain    Dispense:  30 tablet    Refill:  0      Billy Fischer, MD 09/15/15 [phone]

## 2015-12-26 ENCOUNTER — Ambulatory Visit (HOSPITAL_COMMUNITY)
Admission: EM | Admit: 2015-12-26 | Discharge: 2015-12-26 | Disposition: A | Discharge status: Home / Self Care | Payer: 59 | Attending: Emergency Medicine | Admitting: Emergency Medicine

## 2015-12-26 ENCOUNTER — Encounter (HOSPITAL_COMMUNITY): Payer: Self-pay | Admitting: Emergency Medicine

## 2015-12-26 DIAGNOSIS — R202 Paresthesia of skin: Secondary | ICD-10-CM

## 2015-12-26 LAB — POCT I-STAT, CHEM 8
BUN: 10 mg/dL (ref 6–20)
CHLORIDE: 103 mmol/L (ref 101–111)
Calcium, Ion: 1.29 mmol/L (ref 1.13–1.30)
Creatinine, Ser: 0.6 mg/dL (ref 0.44–1.00)
Glucose, Bld: 82 mg/dL (ref 65–99)
HCT: 44 % (ref 36.0–46.0)
Hemoglobin: 15 g/dL (ref 12.0–15.0)
Potassium: 3.8 mmol/L (ref 3.5–5.1)
Sodium: 138 mmol/L (ref 135–145)
TCO2: 25 mmol/L (ref 0–100)

## 2015-12-26 NOTE — Discharge Instructions (Signed)
Paresthesia Paresthesia is an abnormal burning or prickling sensation. This sensation is generally felt in the hands, arms, legs, or feet. However, it may occur in any part of the body. Usually, it is not painful. The feeling may be described as:  Tingling or numbness.  Pins and needles.  Skin crawling.  Buzzing.  Limbs falling asleep.  Itching. Most people experience temporary (transient) paresthesia at some time in their lives. Paresthesia may occur when you breathe too quickly (hyperventilation). It can also occur without any apparent cause. Commonly, paresthesia occurs when pressure is placed on a nerve. The sensation quickly goes away after the pressure is removed. For some people, however, paresthesia is a long-lasting (chronic) condition that is caused by an underlying disorder. If you continue to have paresthesia, you may need further medical evaluation. HOME CARE INSTRUCTIONS Watch your condition for any changes. Taking the following actions may help to lessen any discomfort that you are feeling:  Avoid drinking alcohol.  Try acupuncture or massage to help relieve your symptoms.  Keep all follow-up visits as directed by your health care provider. This is important. SEEK MEDICAL CARE IF:  You continue to have episodes of paresthesia.  Your burning or prickling feeling gets worse when you walk.  You have pain, cramps, or dizziness.  You develop a rash. SEEK IMMEDIATE MEDICAL CARE IF:  You feel weak.  You have trouble walking or moving.  You have problems with speech, understanding, or vision.  You feel confused.  You cannot control your bladder or bowel movements.  You have numbness after an injury.  You faint.   This information is not intended to replace advice given to you by your health care provider. Make sure you discuss any questions you have with your health care provider.   Document Released: 05/16/2002 Document Revised: 10/10/2014 Document Reviewed:  05/22/2014 Elsevier Interactive Patient Education 2016 Elsevier Inc.  

## 2015-12-26 NOTE — ED Provider Notes (Signed)
CSN: [id number]     Arrival date & time 12/26/15  1907 History   First MD Initiated Contact with Patient 12/26/15 2116     Chief Complaint  Patient presents with  . Numbness   (Consider location/radiation/quality/duration/timing/severity/associated sxs/prior Treatment) HPI Comments: Patient presents with slight bilateral numbness in her legs. Numbness is intermittent but still able to ambulate as usual. Also having tingling and numbness bilaterally in her arms. This started in the past 1-2 days. No vision changes. No facial numbness. She did have a headache a few days ago but that has resolved. No unusual change in activities. No recent infection, surgery or trauma. Patient is concerned since she had a similar episode in 2015 that was dx with possible case of Guillian-Barre Syndrome. She saw a Neurologist at that time and did not have any further treatment. Symptoms had  resolved on own.   The history is provided by the patient.    Past Medical History  Diagnosis Date  . Palpitations   . 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 Adenomyosis     pelvic pain  . Allergic rhinitis   . H/O chlamydia infection 1992    during 1st pregnancy  . Palpitations     with sinus tachycardia  . Chronic kidney disease    Past Surgical History  Procedure Laterality Date  . Tubal ligation Bilateral 2001   Family History  Problem Relation Age of Onset  . Diabetes Mother   . Breast cancer Mother   . Heart failure Mother   . Hypertension Mother   . Parkinsonism Father   . Hypertension Father   . Lung cancer Brother   . Diabetes Sister   . Hypertension Sister   . CAD Sister   . Lung cancer Brother 58    youngest brother  . Anemia     Social History  Substance Use Topics  . Smoking status: Never Smoker   . Smokeless tobacco: Never Used  . Alcohol Use: No   OB History    No data available     Review of Systems  Constitutional: Positive for fatigue. Negative for fever.  Eyes: Negative for visual  disturbance.  Gastrointestinal: Negative for nausea.  Musculoskeletal: Negative for myalgias, arthralgias, gait problem, neck pain and neck stiffness.  Neurological: Positive for numbness. Negative for dizziness, syncope, facial asymmetry, speech difficulty and light-headedness.    Allergies  Penicillins  Home Medications   Prior to Admission medications   Medication Sig Start Date End Date Taking? Authorizing Provider  cyclobenzaprine (FLEXERIL) 5 MG tablet Take 1 tablet (5 mg total) by mouth 3 (three) times daily as needed for muscle spasms. 09/15/15   Billy Fischer, MD  diclofenac (CATAFLAM) 50 MG tablet Take 1 tablet (50 mg total) by mouth 3 (three) times daily. For back pain 09/15/15   Billy Fischer, MD  ibuprofen (ADVIL) 800 MG tablet Take 1 tablet (800 mg total) by mouth every 8 (eight) hours. Patient not taking: Reported on 11/09/2014 03/11/12   Vivi Ferns, MD  vitamin C (ASCORBIC ACID) 250 MG tablet Take 250 mg by mouth as needed. Taking 2 tablets once a day    Historical Provider, MD   Meds Ordered and Administered this Visit  Medications - No data to display  BP 146/77 mmHg  Pulse 87  Temp(Src) 99.1 F (37.3 C) (Oral)  Resp 18  Ht 5\' 5"  (1.651 m)  Wt 110 lb (49.896 kg)  BMI 18.31 kg/m2  SpO2 100%  LMP 11/26/2015 No data found.   Physical Exam  Constitutional: She is oriented to person, place, and time. She appears well-developed and well-nourished. No distress.  HENT:  Head: Normocephalic and atraumatic.  Nose: Nose normal.  Mouth/Throat: Oropharynx is clear and moist.  Eyes: Conjunctivae, EOM and lids are normal. Pupils are equal, round, and reactive to light.  Neck: Trachea normal and normal range of motion. Neck supple.  Cardiovascular: Normal rate, regular rhythm and normal heart sounds.   Pulmonary/Chest: Effort normal and breath sounds normal.  Musculoskeletal: Normal range of motion. She exhibits no tenderness.  Lymphadenopathy:    She has no cervical  adenopathy.  Neurological: She is alert and oriented to person, place, and time. She has normal strength and normal reflexes. No cranial nerve deficit or sensory deficit. She displays a negative Romberg sign.  Skin: Skin is warm, dry and intact.  Psychiatric: She has a normal mood and affect. Her speech is normal and behavior is normal. Judgment and thought content normal.  No abnormalities found on exam.   ED Course  Procedures (including critical care time)  Labs Review Labs Reviewed  POCT I-STAT, CHEM 8    Imaging Review No results found.   Visual Acuity Review  Right Eye Distance:   Left Eye Distance:   Bilateral Distance:    Right Eye Near:   Left Eye Near:    Bilateral Near:         MDM   1. Paresthesias    Reviewed lab results with patient. No abnormalities found. Uncertain of cause of current symptoms. May be neck muscle strain irritation that is causing the sensation of numbness in her arms. Reviewed that additional work-up is needed to determine cause. Recommend patient call her Neurologist tomorrow to schedule appointment for follow-up. Note written to be out of work tomorrow due to current symptoms. May return in 2 days. If numbness, weakness, vision changes or decreased muscle tone occur, go to ER. Otherwise follow-up with Neurologist as planned.     Katy Apo, NP 12/27/15 [phone]

## 2015-12-26 NOTE — ED Notes (Signed)
Patient reports limbs started tingling/numbness involving arms and legs.  Legs have been intermittent, arms constant.  Patient reports having a similar episode 2015.  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

## 2015-12-27 ENCOUNTER — Telehealth: Payer: Self-pay | Admitting: Neurology

## 2015-12-27 NOTE — Telephone Encounter (Signed)
Patient reports tingling of limbs, arms, legs, tingling in arms is constant, tingling in legs comes and goes that started yesterday. Patient requested appointment with Dr. Jaynee Eagles. An appointment was made for next available, August 16th . Patient states she went to [facility].  Advised that the nurse would call if there was any other questions [phone].

## 2015-12-27 NOTE — Telephone Encounter (Signed)
I spoke to pt. The numbness and tingling sensation started again yesterday. She went to Urgent Care and was checked out but was told everything was normal and to follow up with her neurologist. She is wondering if she could have a sooner appt than 8/16 with Sandra Kim. She is concerned that she is having another "mild case of GBS, like Sandra Kim said I had last time."I advised pt that Sandra Kim was out of the office until Tuesday, but on Tuesday, I would ask her if I would be able to work the pt in sooner, or it the pt could see a NP. Pt is agreeable to this. She wants Sandra Kim opinion on the appt when Sandra Kim returns.

## 2015-12-28 DIAGNOSIS — R030 Elevated blood-pressure reading, without diagnosis of hypertension: Secondary | ICD-10-CM | POA: Diagnosis not present

## 2015-12-28 DIAGNOSIS — R202 Paresthesia of skin: Secondary | ICD-10-CM | POA: Diagnosis not present

## 2015-12-31 NOTE — Telephone Encounter (Signed)
I called pt to offer her an appt with Hoyle Sauer, NP this week. No answer, left a message asking her to call me back. If pt calls back, please advise her that Dr. Jaynee Eagles is requesting a follow up with the NP this week and schedule her.

## 2015-12-31 NOTE — Telephone Encounter (Signed)
Yes, please schedule with NP this week. If NP has no appointments, schedule with me at 4:30pm or noon. thanks

## 2015-12-31 NOTE — Telephone Encounter (Signed)
Per Dr Corey Harold to schedule with NP

## 2015-12-31 NOTE — Telephone Encounter (Signed)
Spoke to patient - she requested to see Dr. Jaynee Eagles.  She has been worked-in this week on 01/01/16 at noon.  Patient will arrived 15 minutes early.

## 2016-01-01 ENCOUNTER — Ambulatory Visit: Payer: Self-pay | Admitting: Neurology

## 2016-01-02 ENCOUNTER — Encounter: Payer: Self-pay | Admitting: Neurology

## 2016-01-02 ENCOUNTER — Ambulatory Visit (INDEPENDENT_AMBULATORY_CARE_PROVIDER_SITE_OTHER): Payer: 59 | Admitting: Neurology

## 2016-01-02 VITALS — BP 127/89 | HR 84 | Ht 65.0 in | Wt 104.4 lb

## 2016-01-02 DIAGNOSIS — R200 Anesthesia of skin: Secondary | ICD-10-CM

## 2016-01-02 DIAGNOSIS — R202 Paresthesia of skin: Secondary | ICD-10-CM

## 2016-01-02 DIAGNOSIS — R930 Abnormal findings on diagnostic imaging of skull and head, not elsewhere classified: Secondary | ICD-10-CM | POA: Diagnosis not present

## 2016-01-02 DIAGNOSIS — M5410 Radiculopathy, site unspecified: Secondary | ICD-10-CM | POA: Diagnosis not present

## 2016-01-02 DIAGNOSIS — R258 Other abnormal involuntary movements: Secondary | ICD-10-CM | POA: Diagnosis not present

## 2016-01-02 DIAGNOSIS — G610 Guillain-Barre syndrome: Secondary | ICD-10-CM

## 2016-01-02 DIAGNOSIS — M542 Cervicalgia: Secondary | ICD-10-CM | POA: Diagnosis not present

## 2016-01-02 DIAGNOSIS — G608 Other hereditary and idiopathic neuropathies: Secondary | ICD-10-CM

## 2016-01-02 DIAGNOSIS — R253 Fasciculation: Secondary | ICD-10-CM

## 2016-01-02 DIAGNOSIS — R9082 White matter disease, unspecified: Secondary | ICD-10-CM

## 2016-01-02 NOTE — Patient Instructions (Signed)
Remember to drink plenty of fluid, eat healthy meals and do not skip any meals. Try to eat protein with a every meal and eat a healthy snack such as fruit or nuts in between meals. Try to keep a regular sleep-wake schedule and try to exercise daily, particularly in the form of walking, 20-30 minutes a day, if you can.   As far as diagnostic testing: MRI of the brain and cervical spine  My clinical assistant and will answer any of your questions and relay your messages to me and also relay most of my messages to you.   Our phone number is [phone]. We also have an after hours call service for urgent matters and there is a physician on-call for urgent questions. For any emergencies you know to call 911 or go to the nearest emergency room

## 2016-01-02 NOTE — Progress Notes (Signed)
[facility]    Provider:  Dr Jaynee Eagles Referring Provider: Cari Caraway, MD Primary Care Physician:  Cari Caraway, MD  CC: paresthesias  Interval history 01/02/2016: 45 year old female with no significant PMHx who was originally evaluated for rapidly ascending paresthesias. Neuro exam is normal. 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 Labs, EMG nerve conduction study and MRI of the brain were all unremarkable. She had recurrent episodes in the legs and arms this time. It started when she got to work last Wednesday, started in the arms like a constant numbness and tingling feeling in the arms, lke itching and numbness and tingling, was also happening in the legs, blood pressure was elevated and she went to urgent, no vision changes, no weakness, just numbness and tingling. No recent infection or inciting event, but the previous Saturday had twitching in the face, she did not fall, no balance issues. She had the symptoms for 24 hours and resolved. No sensory change sin the face but did have twitching. Symptoms were below the knees and elbows. She has some neck pain and muscle cervical tightness.   Interval update 11/10/2014: Patient returns today and all symptoms have improved, she denies any paresthesias or numbness in her legs and hands. No weakness, no shortness of breath or chest pain, no focal neurologic deficits.  IMPRESSION:  Equivocal MRI brain (with and without) demonstrating: 1. There is a punctate right parietal focus of non-specific gliosis (series 7 image 16).  2. Small right pontine enhancing lesion, with (83mm; series 11 image 25; series 12 image 10). This is not apparent on T2 or T2FLAIR views. May represent developmental venous anomaly or capillary telangiectasia.   EMG nerve conduction study was normal  Normal labs: Ace, rheumatoid factor, HIV, IFE, pan-anca, Lyme, hemoglobin A1c, RPR (outside labs outside physicians showed  B12 and folate normal, CBC, CMP, ferritin, TSH, sed, ANA comprehensive panel all normal. Cbc/cmp unremarkable)     HPI: Sandra Kim is a 45 y.o. female here as a referral from Dr. Addison Lank for paresthesia. Paresthesias started last Wednesday. Was feeling numbness and tingling in the feet. She put socks on. 10-15 minutes later was in the whole foot to the ankle. Then the sensations came all the way up the legs to above the knees. Symmetrically. Was able to stand, didn't feel weak. The sensation are odd, not painful or hurting. Like pins and needle. It has improved since then but she still feels it especially when she sits down. Now worse at night. Nothing makes it better or worse. Started at home, she was getting ready for bed. No recent illnesses, no trauma, no inciting factors. The symptoms are constant. No other focal neuro problems. No neurodegenerative or neuromuscular family history.  Reviewed notes, labs and imaging from outside physicians, which showed B12 and folate normal, CBC, CMP, ferritin, TSH, sed, ANA comprehensive panel all normal. Cbc/cmp unremarkable  Review of Systems: Patient complains of symptoms per HPI as well as the following symptoms: No chest pain, no shortness of breath. Pertinent negatives per HPI. All others negative.    Social History   Social History  . Marital status: Married    Spouse name: [name]   . Number of children: 3  . Years of education: 12   Occupational History  .  [employer]   Social History Main Topics  . Smoking status: Never Smoker  . Smokeless tobacco: Never Used  . Alcohol use No  . Drug use: No  . Sexual activity: Not  on file   Other Topics Concern  . Not on file   Social History Narrative   Patient is married with 3 children.   Patient is right handed.   Patient has a high school education.   Patient drinks 2 cups daily.    Family History  Problem Relation Age of Onset  . Diabetes Mother   . Breast cancer Mother     . Heart failure Mother   . Hypertension Mother   . Parkinsonism Father   . Hypertension Father   . Lung cancer Brother   . Diabetes Sister   . Hypertension Sister   . CAD Sister   . Lung cancer Brother 38    youngest brother  . Anemia      Past Medical History:  Diagnosis Date  . Adenomyosis    pelvic pain  . Allergic rhinitis   . 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 Chronic kidney disease   . H/O chlamydia infection 1992   during 1st pregnancy  . Palpitations   . Palpitations    with sinus tachycardia    Past Surgical History:  Procedure Laterality Date  . TUBAL LIGATION Bilateral 2001    Current Outpatient Prescriptions  Medication Sig Dispense Refill  . cyclobenzaprine (FLEXERIL) 5 MG tablet Take 1 tablet (5 mg total) by mouth 3 (three) times daily as needed for muscle spasms. 30 tablet 0  . diclofenac (CATAFLAM) 50 MG tablet Take 1 tablet (50 mg total) by mouth 3 (three) times daily. For back pain 30 tablet 0  . ibuprofen (ADVIL) 800 MG tablet Take 1 tablet (800 mg total) by mouth every 8 (eight) hours. 30 tablet 0  . vitamin C (ASCORBIC ACID) 250 MG tablet Take 250 mg by mouth as needed. Taking 2 tablets once a day     No current facility-administered medications for this visit.     Allergies as of 01/02/2016 - Review Complete 12/26/2015  Allergen Reaction Noted  . Penicillins      Vitals: BP 127/89 (BP Location: Right Arm, Patient Position: Sitting, Cuff Size: Normal)   Pulse 84   Ht 5\' 5"  (1.651 m)   Wt 104 lb 6.4 oz (47.4 kg)   LMP 11/26/2015   BMI 17.37 kg/m  Last Weight:  Wt Readings from Last 1 Encounters:  01/02/16 104 lb 6.4 oz (47.4 kg)   Last Height:   Ht Readings from Last 1 Encounters:  01/02/16 5\' 5"  (1.651 m)       Neuro: Focused Neurologic Exam  Speech:    Speech is normal; fluent and spontaneous with normal comprehension.  Cognition:    The patient is oriented to person, place, and time;     recent and remote memory intact;      language fluent;     normal attention, concentration,     fund of knowledge Cranial Nerves:    The pupils are equal, round, and reactive to light.  Visual fields are full to finger confrontation. Extraocular movements are intact. Trigeminal sensation is intact and the muscles of mastication are normal. The face is symmetric. The palate elevates in the midline. Hearing intact. Voice is normal. Shoulder shrug is normal. The tongue has normal motion without fasciculations.     Strength:    Strength is V/V in the upper and lower limbs.      Sensation: left arm numbness as compared to right otherwise intact to LT, pin prick throughout      Assessment/Plan: 45  year old female with no significant PMHx who was originally evaluated for rapidly ascending paresthesias. Neuro exam is normal. 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 Labs, EMG nerve conduction study and MRI of the brain were all unremarkable. Symptoms have resolved.  Today she returns with an episode of acute onset complete arm and leg tingling. This is not consistent with previous suspicions. We'll image MRI of the brain and cervical spine. Unknown etiology of her acute random paresthesias.  [name], MD  [facility] [address] [place], [place] [address]  Phone [phone] Fax [phone]  A total of 30 minutes was spent face-to-face with this patient. Over half this time was spent on counseling patient on the acute onset paresthesias diagnosis and different diagnostic and therapeutic options available

## 2016-01-05 LAB — CBC
HEMATOCRIT: 44.3 % (ref 34.0–46.6)
HEMOGLOBIN: 13.8 g/dL (ref 11.1–15.9)
MCH: 26.7 pg (ref 26.6–33.0)
MCHC: 31.2 g/dL — AB (ref 31.5–35.7)
MCV: 86 fL (ref 79–97)
Platelets: 296 10*3/uL (ref 150–379)
RBC: 5.17 x10E6/uL (ref 3.77–5.28)
RDW: 14.2 % (ref 12.3–15.4)
WBC: 6 10*3/uL (ref 3.4–10.8)

## 2016-01-05 LAB — HEPATIC FUNCTION PANEL
ALK PHOS: 44 IU/L (ref 39–117)
ALT: 9 IU/L (ref 0–32)
AST: 16 IU/L (ref 0–40)
Albumin: 4.4 g/dL (ref 3.5–5.5)
Bilirubin Total: 0.4 mg/dL (ref 0.0–1.2)
Bilirubin, Direct: 0.13 mg/dL (ref 0.00–0.40)
TOTAL PROTEIN: 7 g/dL (ref 6.0–8.5)

## 2016-01-05 LAB — VITAMIN B12: Vitamin B-12: 366 pg/mL (ref 211–946)

## 2016-01-05 LAB — METHYLMALONIC ACID, SERUM: METHYLMALONIC ACID: 80 nmol/L (ref 0–378)

## 2016-01-05 LAB — SEDIMENTATION RATE: Sed Rate: 2 mm/hr (ref 0–32)

## 2016-01-08 ENCOUNTER — Telehealth: Payer: Self-pay | Admitting: *Deleted

## 2016-01-08 NOTE — Telephone Encounter (Signed)
-----   Message from Melvenia Beam, MD sent at 01/07/2016  6:26 PM EDT ----- Labs unremarkable thanks

## 2016-01-08 NOTE — Telephone Encounter (Signed)
LVM for pt  About unremarkable results per Dr Jaynee Eagles. Gave [facility] phone number and hours if she has further questions.

## 2016-01-16 ENCOUNTER — Ambulatory Visit (INDEPENDENT_AMBULATORY_CARE_PROVIDER_SITE_OTHER): Payer: 59

## 2016-01-16 DIAGNOSIS — G610 Guillain-Barre syndrome: Secondary | ICD-10-CM

## 2016-01-16 DIAGNOSIS — M5410 Radiculopathy, site unspecified: Secondary | ICD-10-CM

## 2016-01-16 DIAGNOSIS — R200 Anesthesia of skin: Secondary | ICD-10-CM

## 2016-01-16 DIAGNOSIS — G608 Other hereditary and idiopathic neuropathies: Secondary | ICD-10-CM

## 2016-01-16 DIAGNOSIS — R930 Abnormal findings on diagnostic imaging of skull and head, not elsewhere classified: Secondary | ICD-10-CM

## 2016-01-16 DIAGNOSIS — R258 Other abnormal involuntary movements: Secondary | ICD-10-CM

## 2016-01-16 DIAGNOSIS — R202 Paresthesia of skin: Secondary | ICD-10-CM | POA: Diagnosis not present

## 2016-01-16 DIAGNOSIS — R9082 White matter disease, unspecified: Secondary | ICD-10-CM

## 2016-01-16 DIAGNOSIS — M542 Cervicalgia: Secondary | ICD-10-CM | POA: Diagnosis not present

## 2016-01-16 DIAGNOSIS — R253 Fasciculation: Secondary | ICD-10-CM

## 2016-01-17 MED ORDER — GADOPENTETATE DIMEGLUMINE 469.01 MG/ML IV SOLN: 10.0000 mL | Freq: Once | INTRAVENOUS | Status: AC | PRN

## 2016-01-21 ENCOUNTER — Telehealth: Payer: Self-pay | Admitting: *Deleted

## 2016-01-21 NOTE — Telephone Encounter (Signed)
-----   Message from Melvenia Beam, MD sent at 01/20/2016 12:13 PM EDT ----- MRi of the brain is normal. MRI of the cervical spine showed some mild arthritic changes normal for age but the cord was nirmal. No causes found for symptoms, thanks

## 2016-01-21 NOTE — Telephone Encounter (Signed)
LVM for pt to call about results. Gave [facility] phone number.  

## 2016-01-22 NOTE — Telephone Encounter (Signed)
No cause identified for her symptoms, we will need to monitor at this point and treat symptomatically

## 2016-01-22 NOTE — Telephone Encounter (Signed)
Patient returned Sandra Kim's call. Please call [phone].

## 2016-01-22 NOTE — Telephone Encounter (Signed)
Called and spoke to. Relayed Dr Cathren Laine message below. Pt verbalized understanding and will call if she has new/worsening sx.

## 2016-01-22 NOTE — Telephone Encounter (Signed)
Dr Jaynee Eagles- please advise  Called and spoke to pt about MRI results per Dr Jaynee Eagles note. Pt verbalized understanding.    She would like to know next steps. If there is anything else that could be done. Advised sometimes we do not know what is causing sx, we try and manage sx as best as we can.  Advised I can ask Dr Jaynee Eagles and call her back to advise. She verbalized understanding.

## 2016-01-23 ENCOUNTER — Ambulatory Visit: Payer: 59 | Admitting: Neurology

## 2016-07-01 DIAGNOSIS — Z0000 Encounter for general adult medical examination without abnormal findings: Secondary | ICD-10-CM | POA: Diagnosis not present

## 2016-07-01 DIAGNOSIS — Z1321 Encounter for screening for nutritional disorder: Secondary | ICD-10-CM | POA: Diagnosis not present

## 2016-07-02 MED FILL — VIT D2 1.25 MG (50,000 UNIT: 1.25 MG | 84 days supply | Qty: 12 | Fill #0

## 2016-10-15 DIAGNOSIS — R5383 Other fatigue: Secondary | ICD-10-CM | POA: Diagnosis not present

## 2016-10-15 DIAGNOSIS — Z0000 Encounter for general adult medical examination without abnormal findings: Secondary | ICD-10-CM | POA: Diagnosis not present

## 2016-10-15 DIAGNOSIS — Z1321 Encounter for screening for nutritional disorder: Secondary | ICD-10-CM | POA: Diagnosis not present

## 2016-10-15 DIAGNOSIS — E559 Vitamin D deficiency, unspecified: Secondary | ICD-10-CM | POA: Diagnosis not present

## 2016-12-09 DIAGNOSIS — H524 Presbyopia: Secondary | ICD-10-CM | POA: Diagnosis not present

## 2017-07-10 ENCOUNTER — Other Ambulatory Visit (HOSPITAL_COMMUNITY)
Admission: RE | Admit: 2017-07-10 | Discharge: 2017-07-10 | Disposition: A | Discharge status: Home / Self Care | Payer: 59 | Source: Ambulatory Visit | Attending: Family Medicine | Admitting: Family Medicine

## 2017-07-10 ENCOUNTER — Other Ambulatory Visit: Payer: Self-pay | Admitting: Family Medicine

## 2017-07-10 DIAGNOSIS — Z124 Encounter for screening for malignant neoplasm of cervix: Secondary | ICD-10-CM | POA: Diagnosis not present

## 2017-07-10 DIAGNOSIS — Z8669 Personal history of other diseases of the nervous system and sense organs: Secondary | ICD-10-CM | POA: Diagnosis not present

## 2017-07-10 DIAGNOSIS — Z862 Personal history of diseases of the blood and blood-forming organs and certain disorders involving the immune mechanism: Secondary | ICD-10-CM | POA: Diagnosis not present

## 2017-07-10 DIAGNOSIS — Z0000 Encounter for general adult medical examination without abnormal findings: Secondary | ICD-10-CM | POA: Diagnosis not present

## 2017-07-10 DIAGNOSIS — E559 Vitamin D deficiency, unspecified: Secondary | ICD-10-CM | POA: Diagnosis not present

## 2017-07-13 MED FILL — VIT D2 1.25 MG (50,000 UNIT: 1.25 MG | 84 days supply | Qty: 12 | Fill #0

## 2017-07-14 LAB — CYTOLOGY - PAP: Diagnosis: NEGATIVE

## 2017-07-16 DIAGNOSIS — G43109 Migraine with aura, not intractable, without status migrainosus: Secondary | ICD-10-CM | POA: Diagnosis not present

## 2017-07-16 MED FILL — SUMATRIPTAN SUCC 50 MG TAB: 50 | 30 days supply | Qty: 9 | Fill #0

## 2017-07-29 MED FILL — DOXYCYCLINE HYCLATE 100 MG: 100 | 10 days supply | Qty: 20 | Fill #0

## 2017-08-31 ENCOUNTER — Ambulatory Visit (INDEPENDENT_AMBULATORY_CARE_PROVIDER_SITE_OTHER): Payer: Self-pay | Admitting: Emergency Medicine

## 2017-08-31 VITALS — BP 90/60 | HR 79 | Temp 98.0°F | Resp 16 | Wt 109.8 lb

## 2017-08-31 DIAGNOSIS — M546 Pain in thoracic spine: Secondary | ICD-10-CM

## 2017-08-31 MED ORDER — METHOCARBAMOL 500 MG PO TABS: 500.0000 mg | ORAL_TABLET | Freq: Four times a day (QID) | ORAL | 0 refills | Status: DC

## 2017-08-31 MED ORDER — MELOXICAM 15 MG PO TABS: 15.0000 mg | ORAL_TABLET | Freq: Every day | ORAL | 0 refills | Status: DC

## 2017-08-31 NOTE — Progress Notes (Signed)
S: Sandra Kim is a 47 y.o. female who presents with chief complaint of right upper back/shoulder pain for 2 days. Pain is worse with movement and when she lays on her right side. It is not affected by diet or food, symptoms are not worsened with exercise or activity. She has take ibuprofen with some relief. She denies history of trauma or other injury.   Review of Systems  Constitutional: Negative.   Musculoskeletal: Positive for back pain.  Neurological: Negative.     O:  Vitals:   08/31/17 1737  BP: 90/60  Pulse: 79  Resp: 16  Temp: 98 F (36.7 C)  SpO2: 99%   Physical Exam  Constitutional: She appears well-developed and well-nourished. No distress.  Cardiovascular: Intact distal pulses.  Musculoskeletal: Normal range of motion.       Thoracic back: She exhibits tenderness.       Back:  Negative empty can test, no TTP of the glenohumeral joint, no pain with ROM of either shoulder, point tenderness right scapula 2 inches off the midline of the spine.  Neurological: She is alert.  Skin: Skin is warm. Capillary refill takes less than 2 seconds. She is not diaphoretic.  Psychiatric: She has a normal mood and affect.  Nursing note and vitals reviewed.   A:  1. Acute midline thoracic back pain     P: 1. Acute midline thoracic back pain Robaxin and Meloxicam, heating pads, ROM exercises, follow up with PCP as needed. ER if worse or if concern for cardiac symptoms

## 2017-08-31 NOTE — Patient Instructions (Signed)
Back Exercises If you have pain in your back, do these exercises 2-3 times each day or as told by your doctor. When the pain goes away, do the exercises once each day, but repeat the steps more times for each exercise (do more repetitions). If you do not have pain in your back, do these exercises once each day or as told by your doctor. Exercises Single Knee to Chest  Do these steps 3-5 times in a row for each leg: 1. Lie on your back on a firm bed or the floor with your legs stretched out. 2. Bring one knee to your chest. 3. Hold your knee to your chest by grabbing your knee or thigh. 4. Pull on your knee until you feel a gentle stretch in your lower back. 5. Keep doing the stretch for 10-30 seconds. 6. Slowly let go of your leg and straighten it.  Pelvic Tilt  Do these steps 5-10 times in a row: 1. Lie on your back on a firm bed or the floor with your legs stretched out. 2. Bend your knees so they point up to the ceiling. Your feet should be flat on the floor. 3. Tighten your lower belly (abdomen) muscles to press your lower back against the floor. This will make your tailbone point up to the ceiling instead of pointing down to your feet or the floor. 4. Stay in this position for 5-10 seconds while you gently tighten your muscles and breathe evenly.  Cat-Cow  Do these steps until your lower back bends more easily: 1. Get on your hands and knees on a firm surface. Keep your hands under your shoulders, and keep your knees under your hips. You may put padding under your knees. 2. Let your head hang down, and make your tailbone point down to the floor so your lower back is round like the back of a cat. 3. Stay in this position for 5 seconds. 4. Slowly lift your head and make your tailbone point up to the ceiling so your back hangs low (sags) like the back of a cow. 5. Stay in this position for 5 seconds.  Press-Ups  Do these steps 5-10 times in a row: 1. Lie on your belly (face-down)  on the floor. 2. Place your hands near your head, about shoulder-width apart. 3. While you keep your back relaxed and keep your hips on the floor, slowly straighten your arms to raise the top half of your body and lift your shoulders. Do not use your back muscles. To make yourself more comfortable, you may change where you place your hands. 4. Stay in this position for 5 seconds. 5. Slowly return to lying flat on the floor.  Bridges  Do these steps 10 times in a row: 1. Lie on your back on a firm surface. 2. Bend your knees so they point up to the ceiling. Your feet should be flat on the floor. 3. Tighten your butt muscles and lift your butt off of the floor until your waist is almost as high as your knees. If you do not feel the muscles working in your butt and the back of your thighs, slide your feet 1-2 inches farther away from your butt. 4. Stay in this position for 3-5 seconds. 5. Slowly lower your butt to the floor, and let your butt muscles relax.  If this exercise is too easy, try doing it with your arms crossed over your chest. Belly Crunches  Do these steps 5-10 times in   a row: 1. Lie on your back on a firm bed or the floor with your legs stretched out. 2. Bend your knees so they point up to the ceiling. Your feet should be flat on the floor. 3. Cross your arms over your chest. 4. Tip your chin a little bit toward your chest but do not bend your neck. 5. Tighten your belly muscles and slowly raise your chest just enough to lift your shoulder blades a tiny bit off of the floor. 6. Slowly lower your chest and your head to the floor.  Back Lifts Do these steps 5-10 times in a row: 1. Lie on your belly (face-down) with your arms at your sides, and rest your forehead on the floor. 2. Tighten the muscles in your legs and your butt. 3. Slowly lift your chest off of the floor while you keep your hips on the floor. Keep the back of your head in line with the curve in your back. Look at  the floor while you do this. 4. Stay in this position for 3-5 seconds. 5. Slowly lower your chest and your face to the floor.  Contact a doctor if:  Your back pain gets a lot worse when you do an exercise.  Your back pain does not lessen 2 hours after you exercise. If you have any of these problems, stop doing the exercises. Do not do them again unless your doctor says it is okay. Get help right away if:  You have sudden, very bad back pain. If this happens, stop doing the exercises. Do not do them again unless your doctor says it is okay. This information is not intended to replace advice given to you by your health care provider. Make sure you discuss any questions you have with your health care provider. Document Released: 06/28/2010 Document Revised: 11/01/2015 Document Reviewed: 07/20/2014 Elsevier Interactive Patient Education  2018 [employer]  Back Pain, Adult Back pain is very common. The pain often gets better over time. The cause of back pain is usually not dangerous. Most people can learn to manage their back pain on their own. Follow these instructions at home: Watch your back pain for any changes. The following actions may help to lessen any pain you are feeling:  Stay active. Start with short walks on flat ground if you can. Try to walk farther each day.  Exercise regularly as told by your doctor. Exercise helps your back heal faster. It also helps avoid future injury by keeping your muscles strong and flexible.  Do not sit, drive, or stand in one place for more than 30 minutes.  Do not stay in bed. Resting more than 1-2 days can slow down your recovery.  Be careful when you bend or lift an object. Use good form when lifting: ? Bend at your knees. ? Keep the object close to your body. ? Do not twist.  Sleep on a firm mattress. Lie on your side, and bend your knees. If you lie on your back, put a pillow under your knees.  Take medicines only as told by your  doctor.  Put ice on the injured area. ? Put ice in a plastic bag. ? Place a towel between your skin and the bag. ? Leave the ice on for 20 minutes, 2-3 times a day for the first 2-3 days. After that, you can switch between ice and heat packs.  Avoid feeling anxious or stressed. Find good ways to deal with stress, such as exercise.    Maintain a healthy weight. Extra weight puts stress on your back.  Contact a doctor if:  You have pain that does not go away with rest or medicine.  You have worsening pain that goes down into your legs or buttocks.  You have pain that does not get better in one week.  You have pain at night.  You lose weight.  You have a fever or chills. Get help right away if:  You cannot control when you poop (bowel movement) or pee (urinate).  Your arms or legs feel weak.  Your arms or legs lose feeling (numbness).  You feel sick to your stomach (nauseous) or throw up (vomit).  You have belly (abdominal) pain.  You feel like you may pass out (faint). This information is not intended to replace advice given to you by your health care provider. Make sure you discuss any questions you have with your health care provider. Document Released: 11/12/2007 Document Revised: 11/01/2015 Document Reviewed: 09/27/2013 Elsevier Interactive Patient Education  2018 [employer]  

## 2017-10-15 DIAGNOSIS — R0789 Other chest pain: Secondary | ICD-10-CM | POA: Diagnosis not present

## 2017-10-15 DIAGNOSIS — E559 Vitamin D deficiency, unspecified: Secondary | ICD-10-CM | POA: Diagnosis not present

## 2018-08-05 DIAGNOSIS — E559 Vitamin D deficiency, unspecified: Secondary | ICD-10-CM | POA: Diagnosis not present

## 2018-08-05 DIAGNOSIS — Z862 Personal history of diseases of the blood and blood-forming organs and certain disorders involving the immune mechanism: Secondary | ICD-10-CM | POA: Diagnosis not present

## 2018-08-05 DIAGNOSIS — M25562 Pain in left knee: Secondary | ICD-10-CM | POA: Diagnosis not present

## 2018-08-05 DIAGNOSIS — R51 Headache: Secondary | ICD-10-CM | POA: Diagnosis not present

## 2018-08-05 DIAGNOSIS — Z131 Encounter for screening for diabetes mellitus: Secondary | ICD-10-CM | POA: Diagnosis not present

## 2018-08-05 DIAGNOSIS — Z136 Encounter for screening for cardiovascular disorders: Secondary | ICD-10-CM | POA: Diagnosis not present

## 2018-08-05 DIAGNOSIS — M25561 Pain in right knee: Secondary | ICD-10-CM | POA: Diagnosis not present

## 2018-08-05 DIAGNOSIS — Z0000 Encounter for general adult medical examination without abnormal findings: Secondary | ICD-10-CM | POA: Diagnosis not present

## 2018-08-11 MED FILL — VIT D2 1.25 MG (50,000 UNIT: 1.25 MG | 84 days supply | Qty: 12 | Fill #0

## 2018-08-13 ENCOUNTER — Other Ambulatory Visit: Payer: Self-pay | Admitting: Family Medicine

## 2018-08-13 DIAGNOSIS — Z1231 Encounter for screening mammogram for malignant neoplasm of breast: Secondary | ICD-10-CM

## 2018-08-18 ENCOUNTER — Ambulatory Visit
Admission: RE | Admit: 2018-08-18 | Discharge: 2018-08-18 | Disposition: A | Discharge status: Home / Self Care | Payer: 59 | Source: Ambulatory Visit | Attending: Family Medicine | Admitting: Family Medicine

## 2018-08-18 ENCOUNTER — Other Ambulatory Visit: Payer: Self-pay

## 2018-08-18 DIAGNOSIS — Z1231 Encounter for screening mammogram for malignant neoplasm of breast: Secondary | ICD-10-CM

## 2018-09-09 DIAGNOSIS — R319 Hematuria, unspecified: Secondary | ICD-10-CM | POA: Diagnosis not present

## 2018-09-09 DIAGNOSIS — R002 Palpitations: Secondary | ICD-10-CM | POA: Diagnosis not present

## 2018-09-09 DIAGNOSIS — R1012 Left upper quadrant pain: Secondary | ICD-10-CM | POA: Diagnosis not present

## 2018-09-24 DIAGNOSIS — N029 Recurrent and persistent hematuria with unspecified morphologic changes: Secondary | ICD-10-CM | POA: Diagnosis not present

## 2018-10-21 DIAGNOSIS — R000 Tachycardia, unspecified: Secondary | ICD-10-CM | POA: Diagnosis not present

## 2018-10-21 DIAGNOSIS — E559 Vitamin D deficiency, unspecified: Secondary | ICD-10-CM | POA: Diagnosis not present

## 2018-10-27 ENCOUNTER — Telehealth: Payer: Self-pay | Admitting: *Deleted

## 2018-10-27 NOTE — Telephone Encounter (Signed)
REFERRAL SENT TO SCHEDULING AND NOTES ON FILE FROM [facility] DR. Abigail Butts MCNEILL [phone].

## 2018-11-10 DIAGNOSIS — R350 Frequency of micturition: Secondary | ICD-10-CM | POA: Diagnosis not present

## 2018-11-10 DIAGNOSIS — R351 Nocturia: Secondary | ICD-10-CM | POA: Diagnosis not present

## 2018-11-10 DIAGNOSIS — R3121 Asymptomatic microscopic hematuria: Secondary | ICD-10-CM | POA: Diagnosis not present

## 2018-11-15 ENCOUNTER — Telehealth (INDEPENDENT_AMBULATORY_CARE_PROVIDER_SITE_OTHER): Payer: 59 | Admitting: Cardiology

## 2018-11-15 ENCOUNTER — Encounter: Payer: Self-pay | Admitting: Cardiology

## 2018-11-15 ENCOUNTER — Telehealth: Payer: Self-pay

## 2018-11-15 VITALS — BP 126/80 | Ht 65.0 in | Wt 114.0 lb

## 2018-11-15 DIAGNOSIS — R000 Tachycardia, unspecified: Secondary | ICD-10-CM | POA: Diagnosis not present

## 2018-11-15 DIAGNOSIS — R002 Palpitations: Secondary | ICD-10-CM

## 2018-11-15 NOTE — Progress Notes (Signed)
Virtual Visit via Video Note   This visit type was conducted due to national recommendations for restrictions regarding the COVID-19 Pandemic (e.g. social distancing) in an effort to limit this patient's exposure and mitigate transmission in our community.  Due to her co-morbid illnesses, this patient is at least at moderate risk for complications without adequate follow up.  This format is felt to be most appropriate for this patient at this time.  All issues noted in this document were discussed and addressed.  A limited physical exam was performed with this format.  Please refer to the patient's chart for her consent to telehealth for [facility].   Date:  11/15/2018   ID:  Sandra Kim, DOB May 22, 1971, MRN [id number]  Patient Location: Home Provider Location: Home  PCP:  Cari Caraway, MD  Cardiologist:  Candee Furbish, MD  Electrophysiologist:  None   Evaluation Performed:  Consultation - Sandra Kim was referred by Dr. Leonides Schanz for the evaluation of palpitations/tachycardia.  Chief Complaint: Palpitations/tachycardia  History of Present Illness:    Sandra Kim is a 48 y.o. female with rapid heart rate previously seen by Dr. Theadore Nan on 10/21/2018.  She had been planing of her pulse being over 100 and some mild chest discomfort surrounding that visit.  Previously had been recommended by Dr. Rex Kras on phone to discontinue Claritin-D and caffeine intake.  It was suggested that she come in for a EKG and thyroid lab work.  After stopping the decongestant and caffeine feels like it is helped with heart rates being about 90 or so.  No associated syncope or dizziness.  Occasionally she has that sensation as though she needs to cough when her heart rate is increased.  No other hyperthyroid type symptoms were noted.  Previous stressful situation given COVID-19.  Those symptoms were about a month. High in the office 134 originally.  Now 87-90. Doing much better.  No chest pain fevers  chills nausea vomiting syncope.  Occasionally she will have a twinge of musculoskeletal discomfort in her chest wall, benign.  Had been on metoprolol previously to help slow down her heart a few years ago but she took it at night because of low blood pressure.  EKG was done at [facility] and demonstrated sinus rhythm 68 with no other abnormalities.  Personally reviewed EKG.  TSH was 0.55, creatinine 0.65, potassium 4.8, hemoglobin 14.2  In the past she had had some dysfunctional uterine bleeding, palpitations with sinus tachycardia diagnosed with a Holter monitor back in 2006.  2011 echocardiogram showed normal EF with mild mitral regurgitation.  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  Non-smoker.    The patient does not have symptoms concerning for COVID-19 infection (fever, chills, cough, or new shortness of breath).    Past Medical History:  Diagnosis Date  . Adenomyosis    pelvic pain  . Allergic rhinitis   . 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 Chronic kidney disease   . H/O chlamydia infection 1992   during 1st pregnancy  . Palpitations   . Palpitations    with sinus tachycardia   Past Surgical History:  Procedure Laterality Date  . TUBAL LIGATION Bilateral 2001     Current Meds  Medication Sig  . Vitamin D, Ergocalciferol, (DRISDOL) 50000 units CAPS capsule      Allergies:   Penicillins   Social History   Tobacco Use  . Smoking status: Never Smoker  . Smokeless tobacco:  Never Used  Substance Use Topics  . Alcohol use: No  . Drug use: No     Family Hx: The patient's family history includes Anemia in an other family member; Breast cancer in her mother; CAD in her sister; Diabetes in her mother and sister; Heart failure in her mother; Hypertension in her father, mother, and sister; Lung cancer in her brother; Lung cancer (age of onset: 62) in her brother; Parkinsonism in her father.  ROS:   Please see the history of present illness.      All other systems reviewed and are negative.   Prior CV studies:   The following studies were reviewed today:  Prior echocardiogram showed normal EF, mild MR.  Benign.  EKG reviewed.  Labs/Other Tests and Data Reviewed:    EKG:  EKG reviewed as above personally, no abnormal intervals.  Normal sinus rhythm.  Recent Labs: No results found for requested labs within last 8760 hours.   Recent Lipid Panel No results found for: CHOL, TRIG, HDL, CHOLHDL, LDLCALC, LDLDIRECT  Wt Readings from Last 3 Encounters:  11/15/18 114 lb (51.7 kg)  08/31/17 109 lb 12.8 oz (49.8 kg)  01/02/16 104 lb 6.4 oz (47.4 kg)     Objective:    Vital Signs:  BP 126/80   Ht 5\' 5"  (1.651 m)   Wt 114 lb (51.7 kg)   BMI 18.97 kg/m    VITAL SIGNS:  reviewed GEN:  no acute distress EYES:  sclerae anicteric, EOMI - Extraocular Movements Intact RESPIRATORY:  normal respiratory effort, symmetric expansion SKIN:  no rash, lesions or ulcers. MUSCULOSKELETAL:  no obvious deformities. NEURO:  alert and oriented x 3, no obvious focal deficit PSYCH:  normal affect  ASSESSMENT & PLAN:    Tachycardia, palpitations - Overall her symptoms have markedly improved since she stopped her decongestant, Claritin-D, and caffeine.  This is excellent.  I agree with Dr. Leonides Schanz that if her heart rate remained above 100 consistently, we may need to try to suppress her tachycardia with metoprolol.  She had taken this in the past at night because of low normal blood pressures.  At this time, I do not see that we need to utilize any further medication.  Let us continue with conservative management.  If she has any more worrisome symptoms, she knows to contact us.  No further testing at this time.  Excellent.   COVID-19 Education: The signs and symptoms of COVID-19 were discussed with the patient and how to seek care for testing (follow up with PCP or arrange E-visit).  The importance of social distancing was discussed today.  Time:    Today, I have spent 19 minutes with the patient with telehealth technology discussing the above problems.     Medication Adjustments/Labs and Tests Ordered: Current medicines are reviewed at length with the patient today.  Concerns regarding medicines are outlined above.   Tests Ordered: No orders of the defined types were placed in this encounter.   Medication Changes: No orders of the defined types were placed in this encounter.   Disposition:  Follow up prn  Signed, Candee Furbish, MD  11/15/2018 9:57 AM    [facility]

## 2018-11-15 NOTE — Telephone Encounter (Signed)
I

## 2018-11-15 NOTE — Telephone Encounter (Signed)
YOUR CARDIOLOGY TEAM HAS ARRANGED FOR AN E-VISIT FOR YOUR APPOINTMENT - PLEASE REVIEW IMPORTANT INFORMATION BELOW SEVERAL DAYS PRIOR TO YOUR APPOINTMENT  Due to the recent COVID-19 pandemic, we are transitioning in-person office visits to tele-medicine visits in an effort to decrease unnecessary exposure to our patients, their families, and staff. These visits are billed to your insurance just like a normal visit is. We also encourage you to sign up for MyChart if you have not already done so. You will need a smartphone if possible. For patients that do not have this, we can still complete the visit using a regular telephone but do prefer a smartphone to enable video when possible. You may have a family member that lives with you that can help. If possible, we also ask that you have a blood pressure cuff and scale at home to measure your blood pressure, heart rate and weight prior to your scheduled appointment. Patients with clinical needs that need an in-person evaluation and testing will still be able to come to the office if absolutely necessary. If you have any questions, feel free to call our office.     YOUR PROVIDER WILL BE USING THE FOLLOWING PLATFORM TO COMPLETE YOUR VISIT: [email or web address]  . IF USING MYCHART - How to Download the MyChart App to Your SmartPhone   - If Apple, go to App Store and type in MyChart in the search bar and download the app. If Android, ask patient to go to Google Play Store and type in MyChart in the search bar and download the app. The app is free but as with any other app downloads, your phone may require you to verify saved payment information or Apple/Android password.  - You will need to then log into the app with your MyChart username and password, and select [facility] as your healthcare provider to link the account.  - When it is time for your visit, go to the MyChart app, find appointments, and click Begin Video Visit. Be sure to Select Allow for your device to  access the Microphone and Camera for your visit. You will then be connected, and your provider will be with you shortly.  **If you have any issues connecting or need assistance, please contact MyChart service desk [phone] ([phone])**  **If using a computer, in order to ensure the best quality for your visit, you will need to use either of the following Internet Browsers: Google Chrome or Microsoft Edge**  . IF USING DOXIMITY or [email or web address].ME - The staff will give you instructions on receiving your link to join the meeting the day of your visit.      2-3 DAYS BEFORE YOUR APPOINTMENT  You will receive a telephone call from one of our HeartCare team members - your caller ID may say "Unknown caller." If this is a video visit, we will walk you through how to get the video launched on your phone. We will remind you check your blood pressure, heart rate and weight prior to your scheduled appointment. If you have an Apple Watch or Kardia, please upload any pertinent ECG strips the day before or morning of your appointment to MyChart. Our staff will also make sure you have reviewed the consent and agree to move forward with your scheduled tele-health visit.     THE DAY OF YOUR APPOINTMENT  Approximately 15 minutes prior to your scheduled appointment, you will receive a telephone call from one of HeartCare team - your caller ID may say "Unknown caller."    Our staff will confirm medications, vital signs for the day and any symptoms you may be experiencing. Please have this information available prior to the time of visit start. It may also be helpful for you to have a pad of paper and pen handy for any instructions given during your visit. They will also walk you through joining the smartphone meeting if this is a video visit.    CONSENT FOR TELE-HEALTH VISIT - PLEASE REVIEW  I hereby voluntarily request, consent and authorize [facility] and its employed or contracted physicians, physician  assistants, nurse practitioners or other licensed health care professionals (the Practitioner), to provide me with telemedicine health care services (the "Services") as deemed necessary by the treating Practitioner. I acknowledge and consent to receive the Services by the Practitioner via telemedicine. I understand that the telemedicine visit will involve communicating with the Practitioner through live audiovisual communication technology and the disclosure of certain medical information by electronic transmission. I acknowledge that I have been given the opportunity to request an in-person assessment or other available alternative prior to the telemedicine visit and am voluntarily participating in the telemedicine visit.  I understand that I have the right to withhold or withdraw my consent to the use of telemedicine in the course of my care at any time, without affecting my right to future care or treatment, and that the Practitioner or I may terminate the telemedicine visit at any time. I understand that I have the right to inspect all information obtained and/or recorded in the course of the telemedicine visit and may receive copies of available information for a reasonable fee.  I understand that some of the potential risks of receiving the Services via telemedicine include:  . Delay or interruption in medical evaluation due to technological equipment failure or disruption; . Information transmitted may not be sufficient (e.g. poor resolution of images) to allow for appropriate medical decision making by the Practitioner; and/or  . In rare instances, security protocols could fail, causing a breach of personal health information.  Furthermore, I acknowledge that it is my responsibility to provide information about my medical history, conditions and care that is complete and accurate to the best of my ability. I acknowledge that Practitioner's advice, recommendations, and/or decision may be based on  factors not within their control, such as incomplete or inaccurate data provided by me or distortions of diagnostic images or specimens that may result from electronic transmissions. I understand that the practice of medicine is not an exact science and that Practitioner makes no warranties or guarantees regarding treatment outcomes. I acknowledge that I will receive a copy of this consent concurrently upon execution via email to the email address I last provided but may also request a printed copy by calling the office of [facility].    I understand that my insurance will be billed for this visit.   I have read or had this consent read to me. . I understand the contents of this consent, which adequately explains the benefits and risks of the Services being provided via telemedicine.  . I have been provided ample opportunity to ask questions regarding this consent and the Services and have had my questions answered to my satisfaction. . I give my informed consent for the services to be provided through the use of telemedicine in my medical care  By participating in this telemedicine visit I agree to the above.  

## 2018-11-15 NOTE — Patient Instructions (Signed)
Medication Instructions:  Your physician recommends that you continue on your current medications as directed. Please refer to the Current Medication list given to you today.  If you need a refill on your cardiac medications before your next appointment, please call your pharmacy.   Lab work: None Ordered If you have labs (blood work) drawn today and your tests are completely normal, you will receive your results only by: Marland Kitchen MyChart Message (if you have MyChart) OR . A paper copy in the mail If you have any lab test that is abnormal or we need to change your treatment, we will call you to review the results.  Testing/Procedures: None ordered  Follow-Up: As needed At [facility], you and your health needs are our priority.  As part of our continuing mission to provide you with exceptional heart care, we have created designated Provider Care Teams.  These Care Teams include your primary Cardiologist (physician) and Advanced Practice Providers (APPs -  Physician Assistants and Nurse Practitioners) who all work together to provide you with the care you need, when you need it.  Any Other Special Instructions Will Be Listed Below (If Applicable).

## 2018-11-18 DIAGNOSIS — R319 Hematuria, unspecified: Secondary | ICD-10-CM | POA: Diagnosis not present

## 2018-11-18 DIAGNOSIS — R3121 Asymptomatic microscopic hematuria: Secondary | ICD-10-CM | POA: Diagnosis not present

## 2018-12-02 DIAGNOSIS — D7389 Other diseases of spleen: Secondary | ICD-10-CM | POA: Diagnosis not present

## 2018-12-14 ENCOUNTER — Other Ambulatory Visit: Payer: Self-pay | Admitting: Family Medicine

## 2018-12-14 DIAGNOSIS — D7389 Other diseases of spleen: Secondary | ICD-10-CM

## 2019-03-14 ENCOUNTER — Other Ambulatory Visit: Payer: 59

## 2019-03-29 ENCOUNTER — Ambulatory Visit
Admission: RE | Admit: 2019-03-29 | Discharge: 2019-03-29 | Disposition: A | Discharge status: Home / Self Care | Payer: 59 | Source: Ambulatory Visit | Attending: Family Medicine | Admitting: Family Medicine

## 2019-03-29 ENCOUNTER — Other Ambulatory Visit: Payer: Self-pay

## 2019-03-29 ENCOUNTER — Encounter: Payer: Self-pay | Admitting: Radiology

## 2019-03-29 DIAGNOSIS — D7389 Other diseases of spleen: Secondary | ICD-10-CM

## 2019-03-29 MED ORDER — IOPAMIDOL (ISOVUE-300) INJECTION 61%
100.0000 mL | Freq: Once | INTRAVENOUS | Status: AC | PRN
  Administered 2019-03-29: 09:00:00 100 mL via INTRAVENOUS

## 2019-07-09 ENCOUNTER — Telehealth: Payer: 59 | Admitting: Family

## 2019-07-09 DIAGNOSIS — M546 Pain in thoracic spine: Secondary | ICD-10-CM

## 2019-07-09 MED ORDER — BACLOFEN 10 MG PO TABS: 10.0000 mg | ORAL_TABLET | Freq: Three times a day (TID) | ORAL | 0 refills | Status: DC

## 2019-07-09 MED ORDER — ETODOLAC 300 MG PO CAPS: 300.0000 mg | ORAL_CAPSULE | Freq: Three times a day (TID) | ORAL | 0 refills | Status: DC

## 2019-07-09 NOTE — Progress Notes (Signed)

## 2019-08-05 DIAGNOSIS — Z862 Personal history of diseases of the blood and blood-forming organs and certain disorders involving the immune mechanism: Secondary | ICD-10-CM | POA: Diagnosis not present

## 2019-08-05 DIAGNOSIS — R635 Abnormal weight gain: Secondary | ICD-10-CM | POA: Diagnosis not present

## 2019-08-05 DIAGNOSIS — R945 Abnormal results of liver function studies: Secondary | ICD-10-CM | POA: Diagnosis not present

## 2019-08-05 DIAGNOSIS — U071 COVID-19: Secondary | ICD-10-CM | POA: Diagnosis not present

## 2019-08-05 DIAGNOSIS — E559 Vitamin D deficiency, unspecified: Secondary | ICD-10-CM | POA: Diagnosis not present

## 2019-08-25 DIAGNOSIS — Z8669 Personal history of other diseases of the nervous system and sense organs: Secondary | ICD-10-CM | POA: Diagnosis not present

## 2019-08-25 DIAGNOSIS — Z0000 Encounter for general adult medical examination without abnormal findings: Secondary | ICD-10-CM | POA: Diagnosis not present

## 2019-08-25 DIAGNOSIS — J309 Allergic rhinitis, unspecified: Secondary | ICD-10-CM | POA: Diagnosis not present

## 2019-08-25 DIAGNOSIS — E559 Vitamin D deficiency, unspecified: Secondary | ICD-10-CM | POA: Diagnosis not present

## 2019-08-25 DIAGNOSIS — E0590 Thyrotoxicosis, unspecified without thyrotoxic crisis or storm: Secondary | ICD-10-CM | POA: Diagnosis not present

## 2019-08-26 ENCOUNTER — Other Ambulatory Visit: Payer: Self-pay | Admitting: Family Medicine

## 2019-08-26 DIAGNOSIS — Z1231 Encounter for screening mammogram for malignant neoplasm of breast: Secondary | ICD-10-CM

## 2019-09-09 ENCOUNTER — Ambulatory Visit
Admission: RE | Admit: 2019-09-09 | Discharge: 2019-09-09 | Disposition: A | Discharge status: Home / Self Care | Payer: 59 | Source: Ambulatory Visit | Attending: Family Medicine | Admitting: Family Medicine

## 2019-09-09 ENCOUNTER — Other Ambulatory Visit: Payer: Self-pay

## 2019-09-09 DIAGNOSIS — Z1231 Encounter for screening mammogram for malignant neoplasm of breast: Secondary | ICD-10-CM | POA: Diagnosis not present

## 2019-10-08 DIAGNOSIS — H524 Presbyopia: Secondary | ICD-10-CM | POA: Diagnosis not present

## 2019-12-02 DIAGNOSIS — E0590 Thyrotoxicosis, unspecified without thyrotoxic crisis or storm: Secondary | ICD-10-CM | POA: Diagnosis not present

## 2020-01-17 DIAGNOSIS — Z8669 Personal history of other diseases of the nervous system and sense organs: Secondary | ICD-10-CM | POA: Diagnosis not present

## 2020-07-31 DIAGNOSIS — R519 Headache, unspecified: Secondary | ICD-10-CM | POA: Diagnosis not present

## 2020-07-31 DIAGNOSIS — R030 Elevated blood-pressure reading, without diagnosis of hypertension: Secondary | ICD-10-CM | POA: Diagnosis not present

## 2020-08-30 ENCOUNTER — Other Ambulatory Visit (HOSPITAL_COMMUNITY): Payer: Self-pay | Admitting: Family Medicine

## 2020-08-30 DIAGNOSIS — M545 Low back pain, unspecified: Secondary | ICD-10-CM | POA: Diagnosis not present

## 2020-08-30 DIAGNOSIS — E0590 Thyrotoxicosis, unspecified without thyrotoxic crisis or storm: Secondary | ICD-10-CM | POA: Diagnosis not present

## 2020-08-30 DIAGNOSIS — J301 Allergic rhinitis due to pollen: Secondary | ICD-10-CM | POA: Diagnosis not present

## 2020-08-30 DIAGNOSIS — Z0000 Encounter for general adult medical examination without abnormal findings: Secondary | ICD-10-CM | POA: Diagnosis not present

## 2020-08-30 DIAGNOSIS — I10 Essential (primary) hypertension: Secondary | ICD-10-CM | POA: Diagnosis not present

## 2020-08-30 DIAGNOSIS — Z124 Encounter for screening for malignant neoplasm of cervix: Secondary | ICD-10-CM | POA: Diagnosis not present

## 2020-08-30 DIAGNOSIS — N951 Menopausal and female climacteric states: Secondary | ICD-10-CM | POA: Diagnosis not present

## 2020-08-30 DIAGNOSIS — Z1211 Encounter for screening for malignant neoplasm of colon: Secondary | ICD-10-CM | POA: Diagnosis not present

## 2020-08-30 DIAGNOSIS — E559 Vitamin D deficiency, unspecified: Secondary | ICD-10-CM | POA: Diagnosis not present

## 2020-08-30 MED FILL — METOPROLOL SUCCINATE ER 25: 25 | 30 days supply | Qty: 30 | Fill #0

## 2020-08-31 ENCOUNTER — Other Ambulatory Visit (HOSPITAL_COMMUNITY): Payer: Self-pay | Admitting: Family Medicine

## 2020-09-01 MED FILL — ETODOLAC 300 MG CAPS: 300 | 10 days supply | Qty: 30 | Fill #0

## 2020-09-01 MED FILL — BACLOFEN 10 MG TABS: 10 | 10 days supply | Qty: 30 | Fill #0

## 2020-09-10 ENCOUNTER — Other Ambulatory Visit (HOSPITAL_COMMUNITY): Payer: Self-pay

## 2020-09-10 DIAGNOSIS — M5416 Radiculopathy, lumbar region: Secondary | ICD-10-CM | POA: Diagnosis not present

## 2020-09-10 MED ORDER — CYCLOBENZAPRINE HCL 10 MG PO TABS
ORAL_TABLET | ORAL | 0 refills | Status: DC
  Filled 2020-09-10: qty 20, 20d supply, fill #0

## 2020-09-11 DIAGNOSIS — M5116 Intervertebral disc disorders with radiculopathy, lumbar region: Secondary | ICD-10-CM | POA: Diagnosis not present

## 2020-09-11 DIAGNOSIS — M25551 Pain in right hip: Secondary | ICD-10-CM | POA: Diagnosis not present

## 2020-09-11 DIAGNOSIS — M9905 Segmental and somatic dysfunction of pelvic region: Secondary | ICD-10-CM | POA: Diagnosis not present

## 2020-09-11 DIAGNOSIS — M9903 Segmental and somatic dysfunction of lumbar region: Secondary | ICD-10-CM | POA: Diagnosis not present

## 2020-09-13 DIAGNOSIS — M9903 Segmental and somatic dysfunction of lumbar region: Secondary | ICD-10-CM | POA: Diagnosis not present

## 2020-09-13 DIAGNOSIS — M25551 Pain in right hip: Secondary | ICD-10-CM | POA: Diagnosis not present

## 2020-09-13 DIAGNOSIS — M9905 Segmental and somatic dysfunction of pelvic region: Secondary | ICD-10-CM | POA: Diagnosis not present

## 2020-09-13 DIAGNOSIS — M5116 Intervertebral disc disorders with radiculopathy, lumbar region: Secondary | ICD-10-CM | POA: Diagnosis not present

## 2020-09-18 DIAGNOSIS — M9905 Segmental and somatic dysfunction of pelvic region: Secondary | ICD-10-CM | POA: Diagnosis not present

## 2020-09-18 DIAGNOSIS — M25551 Pain in right hip: Secondary | ICD-10-CM | POA: Diagnosis not present

## 2020-09-18 DIAGNOSIS — M9903 Segmental and somatic dysfunction of lumbar region: Secondary | ICD-10-CM | POA: Diagnosis not present

## 2020-09-18 DIAGNOSIS — M5116 Intervertebral disc disorders with radiculopathy, lumbar region: Secondary | ICD-10-CM | POA: Diagnosis not present

## 2020-09-20 DIAGNOSIS — M5116 Intervertebral disc disorders with radiculopathy, lumbar region: Secondary | ICD-10-CM | POA: Diagnosis not present

## 2020-09-20 DIAGNOSIS — M9903 Segmental and somatic dysfunction of lumbar region: Secondary | ICD-10-CM | POA: Diagnosis not present

## 2020-09-20 DIAGNOSIS — M9905 Segmental and somatic dysfunction of pelvic region: Secondary | ICD-10-CM | POA: Diagnosis not present

## 2020-09-20 DIAGNOSIS — M25551 Pain in right hip: Secondary | ICD-10-CM | POA: Diagnosis not present

## 2020-09-25 DIAGNOSIS — M5116 Intervertebral disc disorders with radiculopathy, lumbar region: Secondary | ICD-10-CM | POA: Diagnosis not present

## 2020-09-25 DIAGNOSIS — M9903 Segmental and somatic dysfunction of lumbar region: Secondary | ICD-10-CM | POA: Diagnosis not present

## 2020-09-25 DIAGNOSIS — M9905 Segmental and somatic dysfunction of pelvic region: Secondary | ICD-10-CM | POA: Diagnosis not present

## 2020-09-25 DIAGNOSIS — M25551 Pain in right hip: Secondary | ICD-10-CM | POA: Diagnosis not present

## 2020-09-27 DIAGNOSIS — M9905 Segmental and somatic dysfunction of pelvic region: Secondary | ICD-10-CM | POA: Diagnosis not present

## 2020-09-27 DIAGNOSIS — M5116 Intervertebral disc disorders with radiculopathy, lumbar region: Secondary | ICD-10-CM | POA: Diagnosis not present

## 2020-09-27 DIAGNOSIS — M25551 Pain in right hip: Secondary | ICD-10-CM | POA: Diagnosis not present

## 2020-09-27 DIAGNOSIS — M9903 Segmental and somatic dysfunction of lumbar region: Secondary | ICD-10-CM | POA: Diagnosis not present

## 2020-09-28 ENCOUNTER — Other Ambulatory Visit: Payer: Self-pay | Admitting: Family Medicine

## 2020-09-28 DIAGNOSIS — Z1231 Encounter for screening mammogram for malignant neoplasm of breast: Secondary | ICD-10-CM

## 2020-10-01 ENCOUNTER — Other Ambulatory Visit (HOSPITAL_COMMUNITY): Payer: Self-pay

## 2020-10-01 MED FILL — Metoprolol Succinate Tab ER 24HR 25 MG (Tartrate Equiv): ORAL | 30 days supply | Qty: 30 | Fill #0 | Status: AC

## 2020-10-02 DIAGNOSIS — M9903 Segmental and somatic dysfunction of lumbar region: Secondary | ICD-10-CM | POA: Diagnosis not present

## 2020-10-02 DIAGNOSIS — M9905 Segmental and somatic dysfunction of pelvic region: Secondary | ICD-10-CM | POA: Diagnosis not present

## 2020-10-02 DIAGNOSIS — M25551 Pain in right hip: Secondary | ICD-10-CM | POA: Diagnosis not present

## 2020-10-02 DIAGNOSIS — M5116 Intervertebral disc disorders with radiculopathy, lumbar region: Secondary | ICD-10-CM | POA: Diagnosis not present

## 2020-10-04 DIAGNOSIS — M5116 Intervertebral disc disorders with radiculopathy, lumbar region: Secondary | ICD-10-CM | POA: Diagnosis not present

## 2020-10-04 DIAGNOSIS — M25551 Pain in right hip: Secondary | ICD-10-CM | POA: Diagnosis not present

## 2020-10-04 DIAGNOSIS — M9905 Segmental and somatic dysfunction of pelvic region: Secondary | ICD-10-CM | POA: Diagnosis not present

## 2020-10-04 DIAGNOSIS — M9903 Segmental and somatic dysfunction of lumbar region: Secondary | ICD-10-CM | POA: Diagnosis not present

## 2020-10-15 DIAGNOSIS — M47816 Spondylosis without myelopathy or radiculopathy, lumbar region: Secondary | ICD-10-CM | POA: Diagnosis not present

## 2020-10-16 DIAGNOSIS — M9903 Segmental and somatic dysfunction of lumbar region: Secondary | ICD-10-CM | POA: Diagnosis not present

## 2020-10-16 DIAGNOSIS — M5116 Intervertebral disc disorders with radiculopathy, lumbar region: Secondary | ICD-10-CM | POA: Diagnosis not present

## 2020-10-16 DIAGNOSIS — M25551 Pain in right hip: Secondary | ICD-10-CM | POA: Diagnosis not present

## 2020-10-16 DIAGNOSIS — M9905 Segmental and somatic dysfunction of pelvic region: Secondary | ICD-10-CM | POA: Diagnosis not present

## 2020-10-22 DIAGNOSIS — M25551 Pain in right hip: Secondary | ICD-10-CM | POA: Diagnosis not present

## 2020-10-22 DIAGNOSIS — M9903 Segmental and somatic dysfunction of lumbar region: Secondary | ICD-10-CM | POA: Diagnosis not present

## 2020-10-22 DIAGNOSIS — M5116 Intervertebral disc disorders with radiculopathy, lumbar region: Secondary | ICD-10-CM | POA: Diagnosis not present

## 2020-10-22 DIAGNOSIS — M9905 Segmental and somatic dysfunction of pelvic region: Secondary | ICD-10-CM | POA: Diagnosis not present

## 2020-10-24 DIAGNOSIS — M25551 Pain in right hip: Secondary | ICD-10-CM | POA: Diagnosis not present

## 2020-10-24 DIAGNOSIS — M9903 Segmental and somatic dysfunction of lumbar region: Secondary | ICD-10-CM | POA: Diagnosis not present

## 2020-10-24 DIAGNOSIS — M5116 Intervertebral disc disorders with radiculopathy, lumbar region: Secondary | ICD-10-CM | POA: Diagnosis not present

## 2020-10-24 DIAGNOSIS — M9905 Segmental and somatic dysfunction of pelvic region: Secondary | ICD-10-CM | POA: Diagnosis not present

## 2020-10-29 DIAGNOSIS — M9903 Segmental and somatic dysfunction of lumbar region: Secondary | ICD-10-CM | POA: Diagnosis not present

## 2020-10-29 DIAGNOSIS — M25551 Pain in right hip: Secondary | ICD-10-CM | POA: Diagnosis not present

## 2020-10-29 DIAGNOSIS — M5116 Intervertebral disc disorders with radiculopathy, lumbar region: Secondary | ICD-10-CM | POA: Diagnosis not present

## 2020-10-29 DIAGNOSIS — M9905 Segmental and somatic dysfunction of pelvic region: Secondary | ICD-10-CM | POA: Diagnosis not present

## 2020-10-31 DIAGNOSIS — M25551 Pain in right hip: Secondary | ICD-10-CM | POA: Diagnosis not present

## 2020-10-31 DIAGNOSIS — M9903 Segmental and somatic dysfunction of lumbar region: Secondary | ICD-10-CM | POA: Diagnosis not present

## 2020-10-31 DIAGNOSIS — M9905 Segmental and somatic dysfunction of pelvic region: Secondary | ICD-10-CM | POA: Diagnosis not present

## 2020-10-31 DIAGNOSIS — M5116 Intervertebral disc disorders with radiculopathy, lumbar region: Secondary | ICD-10-CM | POA: Diagnosis not present

## 2020-11-02 ENCOUNTER — Other Ambulatory Visit (HOSPITAL_COMMUNITY): Payer: Self-pay

## 2020-11-02 MED ORDER — METOPROLOL SUCCINATE ER 25 MG PO TB24
ORAL_TABLET | ORAL | 1 refills | Status: DC
  Filled 2020-11-02 (×2): qty 30, 30d supply, fill #0
  Filled 2020-12-05: qty 30, 30d supply, fill #1

## 2020-11-15 DIAGNOSIS — M9903 Segmental and somatic dysfunction of lumbar region: Secondary | ICD-10-CM | POA: Diagnosis not present

## 2020-11-15 DIAGNOSIS — M5116 Intervertebral disc disorders with radiculopathy, lumbar region: Secondary | ICD-10-CM | POA: Diagnosis not present

## 2020-11-15 DIAGNOSIS — M25551 Pain in right hip: Secondary | ICD-10-CM | POA: Diagnosis not present

## 2020-11-15 DIAGNOSIS — M9905 Segmental and somatic dysfunction of pelvic region: Secondary | ICD-10-CM | POA: Diagnosis not present

## 2020-11-16 ENCOUNTER — Other Ambulatory Visit: Payer: Self-pay

## 2020-11-16 ENCOUNTER — Ambulatory Visit
Admission: RE | Admit: 2020-11-16 | Discharge: 2020-11-16 | Disposition: A | Discharge status: Home / Self Care | Payer: 59 | Source: Ambulatory Visit | Attending: Family Medicine | Admitting: Family Medicine

## 2020-11-16 DIAGNOSIS — Z1231 Encounter for screening mammogram for malignant neoplasm of breast: Secondary | ICD-10-CM

## 2020-11-26 DIAGNOSIS — M25551 Pain in right hip: Secondary | ICD-10-CM | POA: Diagnosis not present

## 2020-11-26 DIAGNOSIS — M9905 Segmental and somatic dysfunction of pelvic region: Secondary | ICD-10-CM | POA: Diagnosis not present

## 2020-11-26 DIAGNOSIS — M9903 Segmental and somatic dysfunction of lumbar region: Secondary | ICD-10-CM | POA: Diagnosis not present

## 2020-11-26 DIAGNOSIS — M5116 Intervertebral disc disorders with radiculopathy, lumbar region: Secondary | ICD-10-CM | POA: Diagnosis not present

## 2020-12-05 ENCOUNTER — Other Ambulatory Visit (HOSPITAL_COMMUNITY): Payer: Self-pay

## 2020-12-05 DIAGNOSIS — M9903 Segmental and somatic dysfunction of lumbar region: Secondary | ICD-10-CM | POA: Diagnosis not present

## 2020-12-05 DIAGNOSIS — M9905 Segmental and somatic dysfunction of pelvic region: Secondary | ICD-10-CM | POA: Diagnosis not present

## 2020-12-05 DIAGNOSIS — M5116 Intervertebral disc disorders with radiculopathy, lumbar region: Secondary | ICD-10-CM | POA: Diagnosis not present

## 2020-12-05 DIAGNOSIS — M25551 Pain in right hip: Secondary | ICD-10-CM | POA: Diagnosis not present

## 2020-12-31 ENCOUNTER — Other Ambulatory Visit (HOSPITAL_COMMUNITY): Payer: Self-pay

## 2020-12-31 MED ORDER — METOPROLOL SUCCINATE ER 25 MG PO TB24
ORAL_TABLET | ORAL | 2 refills | Status: DC
  Filled 2020-12-31 – 2021-01-10 (×2): qty 90, 90d supply, fill #0
  Filled 2021-04-11: qty 90, 90d supply, fill #1

## 2021-01-08 ENCOUNTER — Other Ambulatory Visit (HOSPITAL_COMMUNITY): Payer: Self-pay

## 2021-01-10 ENCOUNTER — Other Ambulatory Visit (HOSPITAL_COMMUNITY): Payer: Self-pay

## 2021-02-22 ENCOUNTER — Other Ambulatory Visit (HOSPITAL_COMMUNITY): Payer: Self-pay

## 2021-02-22 MED ORDER — DOXYCYCLINE MONOHYDRATE 100 MG PO CAPS
ORAL_CAPSULE | ORAL | 0 refills | Status: DC
  Filled 2021-02-22: qty 20, 10d supply, fill #0

## 2021-02-22 MED ORDER — BENZONATATE 200 MG PO CAPS
ORAL_CAPSULE | ORAL | 0 refills | Status: DC
  Filled 2021-02-22: qty 20, 7d supply, fill #0

## 2021-02-28 ENCOUNTER — Other Ambulatory Visit (HOSPITAL_COMMUNITY): Payer: Self-pay

## 2021-02-28 DIAGNOSIS — Z79899 Other long term (current) drug therapy: Secondary | ICD-10-CM | POA: Diagnosis not present

## 2021-02-28 DIAGNOSIS — M79661 Pain in right lower leg: Secondary | ICD-10-CM | POA: Diagnosis not present

## 2021-02-28 DIAGNOSIS — J019 Acute sinusitis, unspecified: Secondary | ICD-10-CM | POA: Diagnosis not present

## 2021-02-28 DIAGNOSIS — I10 Essential (primary) hypertension: Secondary | ICD-10-CM | POA: Diagnosis not present

## 2021-02-28 MED ORDER — HYDROCHLOROTHIAZIDE 12.5 MG PO TABS
ORAL_TABLET | ORAL | 1 refills | Status: DC
  Filled 2021-02-28: qty 30, 30d supply, fill #0

## 2021-03-05 ENCOUNTER — Other Ambulatory Visit (HOSPITAL_COMMUNITY): Payer: Self-pay

## 2021-03-05 MED ORDER — PEG 3350-KCL-NA BICARB-NACL 420 G PO SOLR
ORAL | 0 refills | Status: DC
  Filled 2021-03-05: qty 4000, 1d supply, fill #0

## 2021-03-06 DIAGNOSIS — K5730 Diverticulosis of large intestine without perforation or abscess without bleeding: Secondary | ICD-10-CM | POA: Diagnosis not present

## 2021-03-06 DIAGNOSIS — K648 Other hemorrhoids: Secondary | ICD-10-CM | POA: Diagnosis not present

## 2021-03-06 DIAGNOSIS — D123 Benign neoplasm of transverse colon: Secondary | ICD-10-CM | POA: Diagnosis not present

## 2021-03-06 DIAGNOSIS — Z1211 Encounter for screening for malignant neoplasm of colon: Secondary | ICD-10-CM | POA: Diagnosis not present

## 2021-03-14 DIAGNOSIS — Z79899 Other long term (current) drug therapy: Secondary | ICD-10-CM | POA: Diagnosis not present

## 2021-03-15 ENCOUNTER — Other Ambulatory Visit (HOSPITAL_COMMUNITY): Payer: Self-pay

## 2021-03-15 MED ORDER — HYDROCHLOROTHIAZIDE 25 MG PO TABS
ORAL_TABLET | ORAL | 1 refills | Status: DC
  Filled 2021-03-15: qty 30, 30d supply, fill #0
  Filled 2021-04-23: qty 30, 30d supply, fill #1

## 2021-04-11 ENCOUNTER — Other Ambulatory Visit (HOSPITAL_COMMUNITY): Payer: Self-pay

## 2021-04-23 ENCOUNTER — Other Ambulatory Visit (HOSPITAL_COMMUNITY): Payer: Self-pay

## 2021-05-24 ENCOUNTER — Other Ambulatory Visit (HOSPITAL_COMMUNITY): Payer: Self-pay

## 2021-05-24 MED ORDER — HYDROCHLOROTHIAZIDE 25 MG PO TABS
25.0000 mg | ORAL_TABLET | Freq: Every morning | ORAL | 1 refills | Status: DC
  Filled 2021-05-24: qty 30, 30d supply, fill #0

## 2021-06-21 ENCOUNTER — Other Ambulatory Visit (HOSPITAL_COMMUNITY): Payer: Self-pay

## 2021-06-21 DIAGNOSIS — R232 Flushing: Secondary | ICD-10-CM | POA: Diagnosis not present

## 2021-06-21 DIAGNOSIS — I10 Essential (primary) hypertension: Secondary | ICD-10-CM | POA: Diagnosis not present

## 2021-06-21 DIAGNOSIS — R634 Abnormal weight loss: Secondary | ICD-10-CM | POA: Diagnosis not present

## 2021-06-21 MED ORDER — METOPROLOL SUCCINATE ER 25 MG PO TB24
ORAL_TABLET | ORAL | 1 refills | Status: DC
  Filled 2021-06-21: qty 90, 90d supply, fill #0

## 2021-06-21 MED ORDER — HYDROCHLOROTHIAZIDE 25 MG PO TABS
ORAL_TABLET | ORAL | 1 refills | Status: DC
  Filled 2021-06-21: qty 90, 90d supply, fill #0
  Filled 2021-09-24: qty 90, 90d supply, fill #1

## 2021-06-22 ENCOUNTER — Other Ambulatory Visit (HOSPITAL_COMMUNITY): Payer: Self-pay

## 2021-09-04 DIAGNOSIS — Z13220 Encounter for screening for lipoid disorders: Secondary | ICD-10-CM | POA: Diagnosis not present

## 2021-09-04 DIAGNOSIS — E559 Vitamin D deficiency, unspecified: Secondary | ICD-10-CM | POA: Diagnosis not present

## 2021-09-04 DIAGNOSIS — R7989 Other specified abnormal findings of blood chemistry: Secondary | ICD-10-CM | POA: Diagnosis not present

## 2021-09-04 DIAGNOSIS — I10 Essential (primary) hypertension: Secondary | ICD-10-CM | POA: Diagnosis not present

## 2021-09-06 ENCOUNTER — Other Ambulatory Visit (HOSPITAL_COMMUNITY): Payer: Self-pay

## 2021-09-06 DIAGNOSIS — I10 Essential (primary) hypertension: Secondary | ICD-10-CM | POA: Diagnosis not present

## 2021-09-06 DIAGNOSIS — Z0000 Encounter for general adult medical examination without abnormal findings: Secondary | ICD-10-CM | POA: Diagnosis not present

## 2021-09-06 DIAGNOSIS — E559 Vitamin D deficiency, unspecified: Secondary | ICD-10-CM | POA: Diagnosis not present

## 2021-09-06 DIAGNOSIS — Z1159 Encounter for screening for other viral diseases: Secondary | ICD-10-CM | POA: Diagnosis not present

## 2021-09-06 DIAGNOSIS — J309 Allergic rhinitis, unspecified: Secondary | ICD-10-CM | POA: Diagnosis not present

## 2021-09-06 DIAGNOSIS — Z86010 Personal history of colonic polyps: Secondary | ICD-10-CM | POA: Diagnosis not present

## 2021-09-06 DIAGNOSIS — E0590 Thyrotoxicosis, unspecified without thyrotoxic crisis or storm: Secondary | ICD-10-CM | POA: Diagnosis not present

## 2021-09-06 DIAGNOSIS — N951 Menopausal and female climacteric states: Secondary | ICD-10-CM | POA: Diagnosis not present

## 2021-09-06 MED ORDER — METOPROLOL SUCCINATE ER 25 MG PO TB24
ORAL_TABLET | ORAL | 3 refills | Status: DC
  Filled 2021-09-06 – 2021-09-24 (×2): qty 90, 90d supply, fill #0
  Filled 2022-01-13: qty 90, 90d supply, fill #1
  Filled 2022-05-06: qty 90, 90d supply, fill #2
  Filled 2022-08-11: qty 90, 90d supply, fill #3

## 2021-09-06 MED ORDER — HYDROCHLOROTHIAZIDE 25 MG PO TABS
ORAL_TABLET | ORAL | 3 refills | Status: DC
  Filled 2021-09-06 – 2022-01-14 (×2): qty 90, 90d supply, fill #0
  Filled 2022-04-18: qty 90, 90d supply, fill #1
  Filled 2022-07-22: qty 90, 90d supply, fill #2

## 2021-09-09 ENCOUNTER — Other Ambulatory Visit (HOSPITAL_COMMUNITY): Payer: Self-pay

## 2021-09-17 ENCOUNTER — Other Ambulatory Visit (HOSPITAL_COMMUNITY): Payer: Self-pay

## 2021-09-24 ENCOUNTER — Other Ambulatory Visit (HOSPITAL_COMMUNITY): Payer: Self-pay

## 2022-01-13 ENCOUNTER — Other Ambulatory Visit (HOSPITAL_COMMUNITY): Payer: Self-pay

## 2022-01-14 ENCOUNTER — Other Ambulatory Visit (HOSPITAL_COMMUNITY): Payer: Self-pay

## 2022-02-19 ENCOUNTER — Other Ambulatory Visit (HOSPITAL_COMMUNITY): Payer: Self-pay

## 2022-02-19 DIAGNOSIS — Z23 Encounter for immunization: Secondary | ICD-10-CM | POA: Diagnosis not present

## 2022-02-19 DIAGNOSIS — N951 Menopausal and female climacteric states: Secondary | ICD-10-CM | POA: Diagnosis not present

## 2022-02-19 MED ORDER — NORETHINDRONE ACET-ETHINYL EST 1-20 MG-MCG PO TABS
1.0000 | ORAL_TABLET | Freq: Every day | ORAL | 4 refills | Status: DC
  Filled 2022-02-19: qty 21, 21d supply, fill #0
  Filled 2022-04-04: qty 21, 21d supply, fill #1
  Filled 2022-04-24: qty 21, 21d supply, fill #2
  Filled 2022-05-19: qty 21, 21d supply, fill #3
  Filled 2022-06-11: qty 21, 21d supply, fill #4

## 2022-02-21 ENCOUNTER — Other Ambulatory Visit (HOSPITAL_COMMUNITY): Payer: Self-pay

## 2022-02-21 MED ORDER — PROGESTERONE 200 MG PO CAPS
200.0000 mg | ORAL_CAPSULE | Freq: Every evening | ORAL | 0 refills | Status: DC
  Filled 2022-02-21: qty 10, 10d supply, fill #0

## 2022-03-21 IMAGING — MG MM DIGITAL SCREENING BILAT W/ TOMO AND CAD
6 of 10 series · 6 of 30 positions shown · non-contrast
Comparison: Previous exam(s).

CLINICAL DATA: Screening.

EXAM:
DIGITAL SCREENING BILATERAL MAMMOGRAM WITH TOMOSYNTHESIS AND CAD
TECHNIQUE: Bilateral screening digital craniocaudal and mediolateral oblique
mammograms were obtained. Bilateral screening digital breast
tomosynthesis was performed. The images were evaluated with
computer-aided detection.

[L MLO synth-2D]
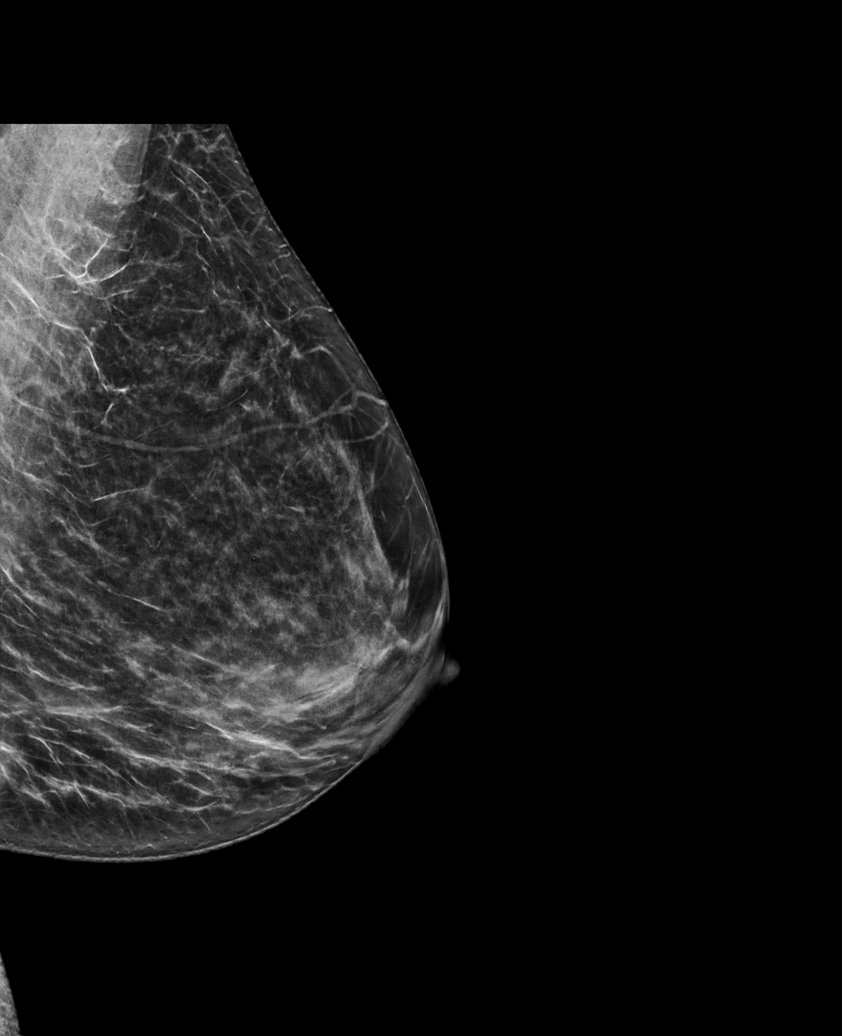

[R MLO synth-2D (1 of 2)]
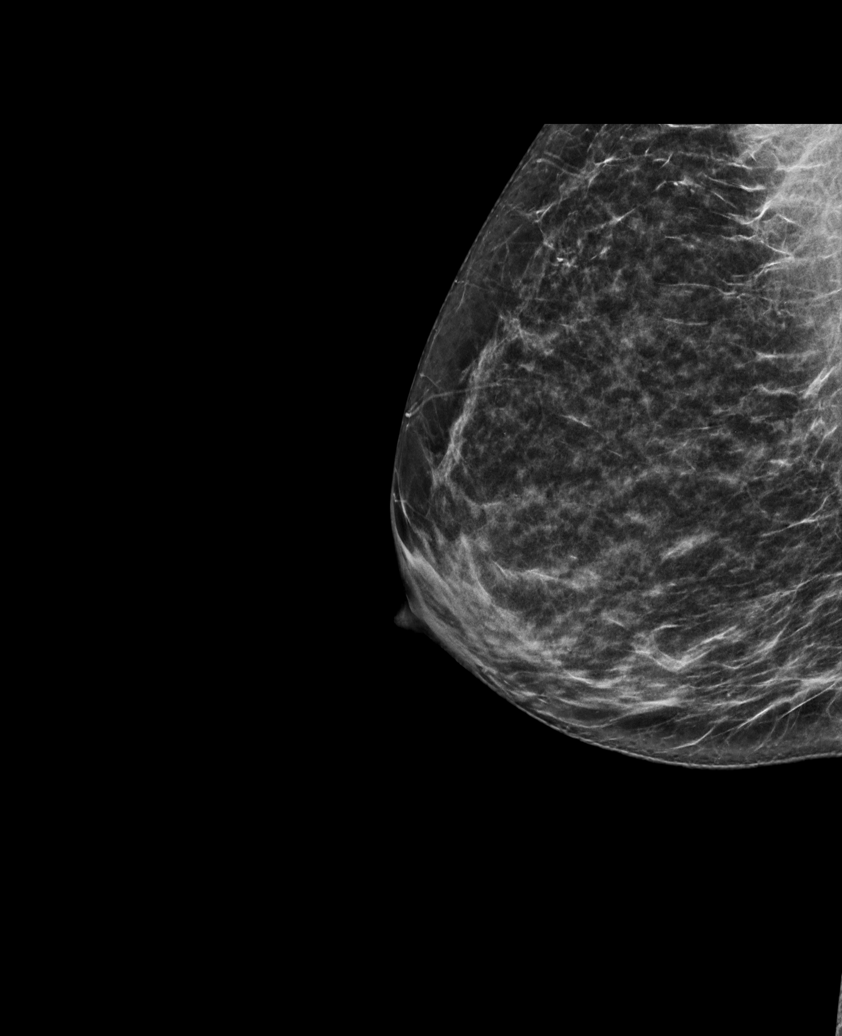

[R CC synth-2D]
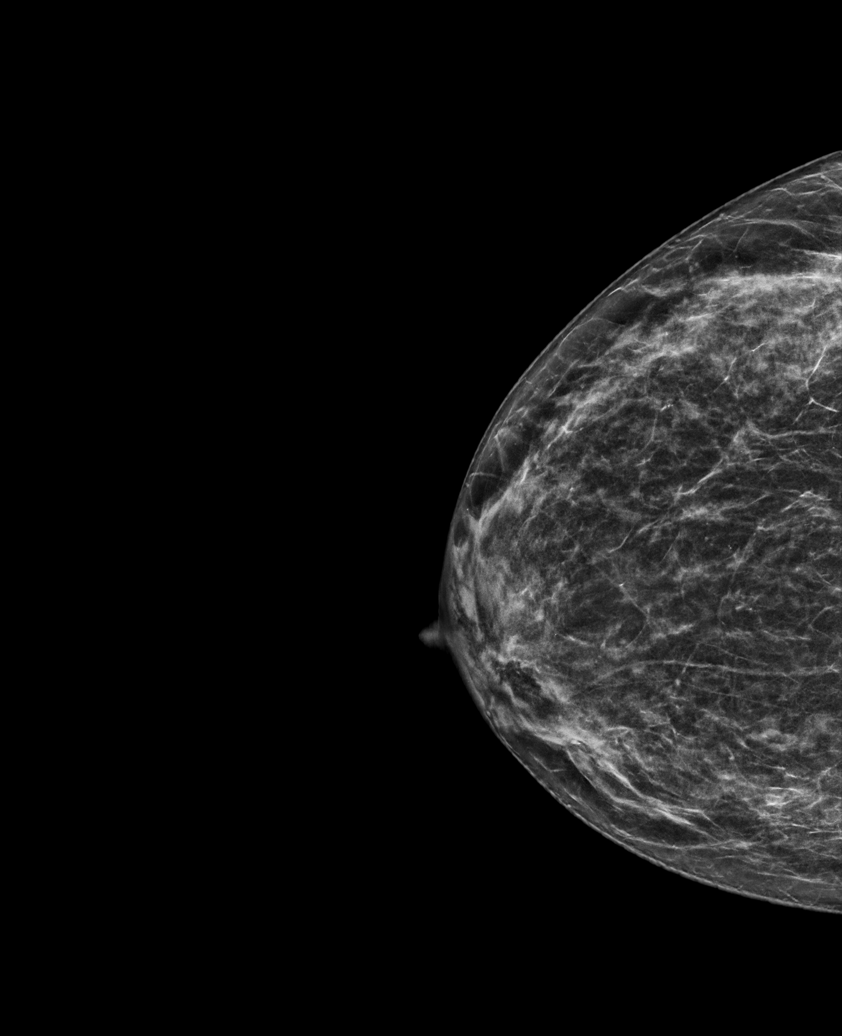

[L CC synth-2D]
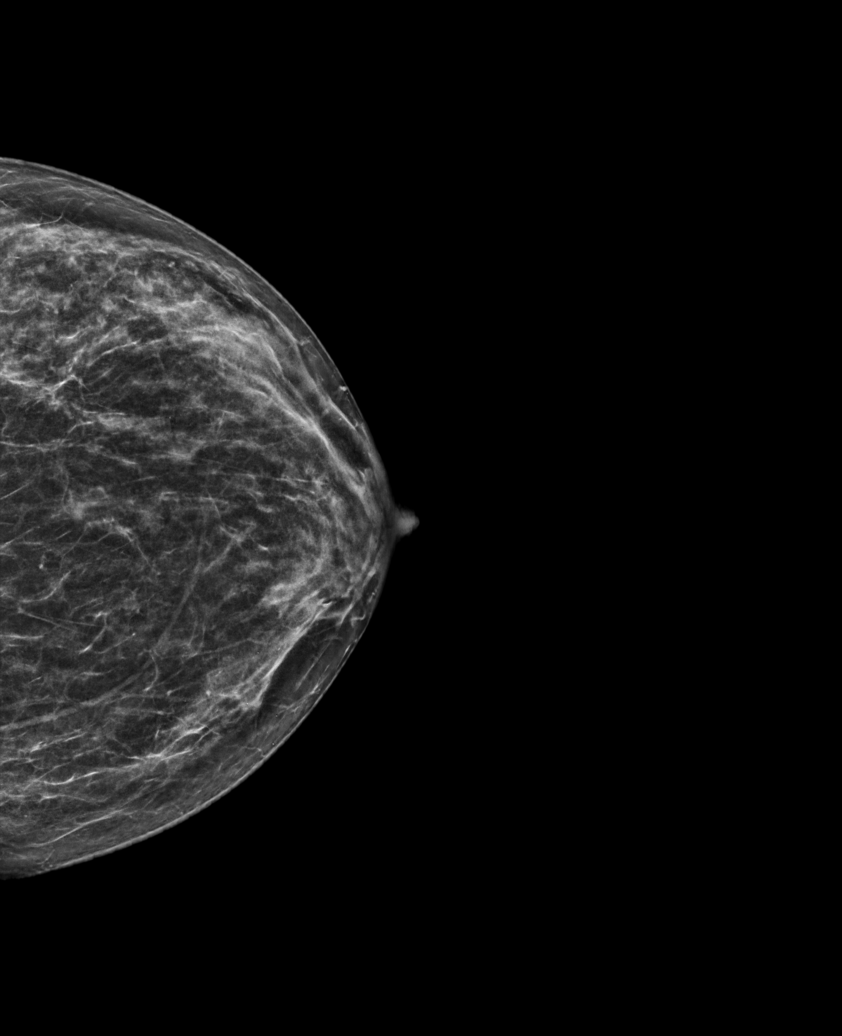

[R MLO synth-2D (2 of 2)]
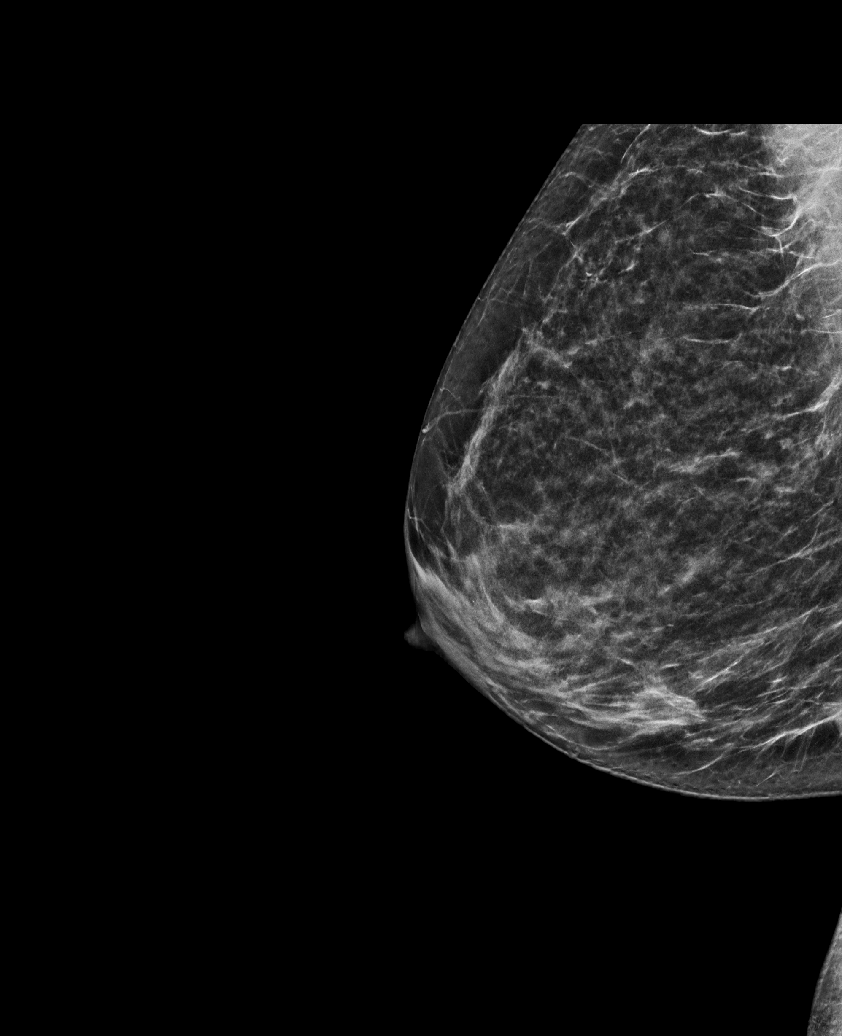

[L MLO tomo · tomo slice 33/65.0]
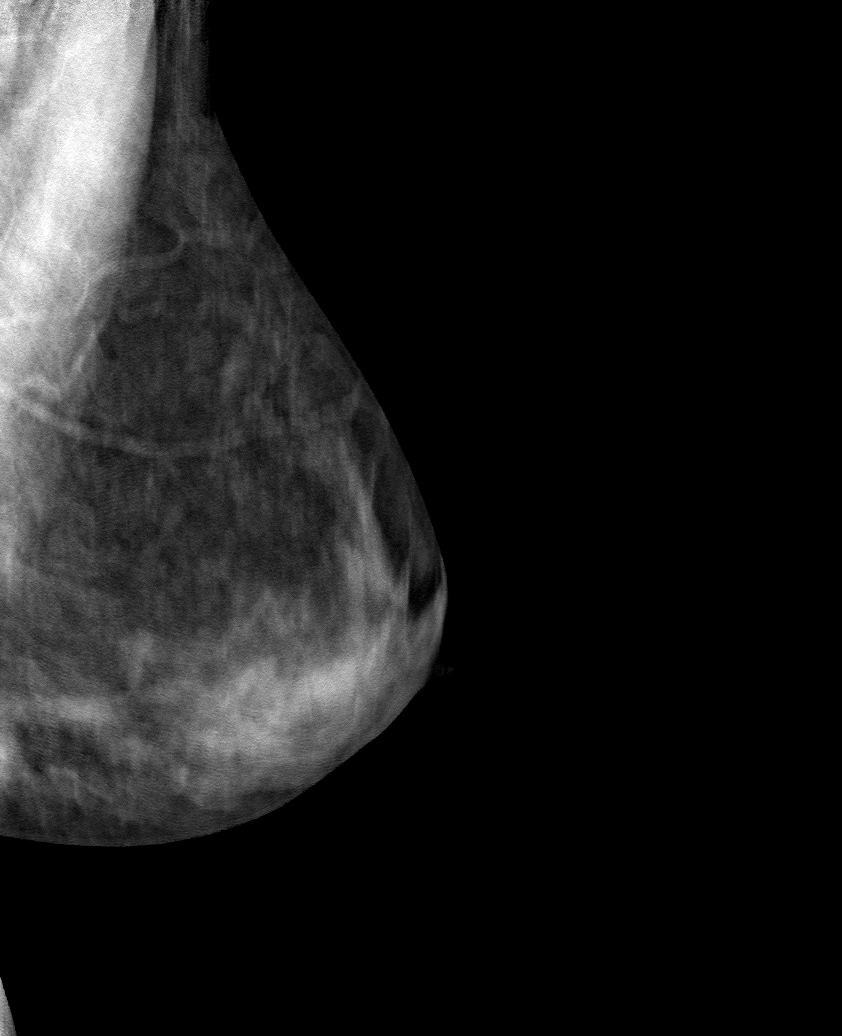

[6 of 30 positions shown; findings below may reference images not displayed]

ACR Breast Density Category b: There are scattered areas of
fibroglandular density.
FINDINGS: There are no findings suspicious for malignancy. The images were
evaluated with computer-aided detection.
IMPRESSION: No mammographic evidence of malignancy. A result letter of this
screening mammogram will be mailed directly to the patient.

RECOMMENDATION:
Screening mammogram in one year. (Code:[id number])

BI-RADS CATEGORY  1: Negative.

## 2022-04-04 ENCOUNTER — Other Ambulatory Visit (HOSPITAL_COMMUNITY): Payer: Self-pay

## 2022-04-18 ENCOUNTER — Other Ambulatory Visit (HOSPITAL_COMMUNITY): Payer: Self-pay

## 2022-04-24 ENCOUNTER — Other Ambulatory Visit (HOSPITAL_COMMUNITY): Payer: Self-pay

## 2022-05-06 ENCOUNTER — Other Ambulatory Visit (HOSPITAL_COMMUNITY): Payer: Self-pay

## 2022-05-19 ENCOUNTER — Other Ambulatory Visit (HOSPITAL_COMMUNITY): Payer: Self-pay

## 2022-06-11 ENCOUNTER — Other Ambulatory Visit (HOSPITAL_COMMUNITY): Payer: Self-pay

## 2022-07-04 ENCOUNTER — Other Ambulatory Visit (HOSPITAL_COMMUNITY): Payer: Self-pay

## 2022-07-05 ENCOUNTER — Other Ambulatory Visit (HOSPITAL_COMMUNITY): Payer: Self-pay

## 2022-07-07 ENCOUNTER — Other Ambulatory Visit (HOSPITAL_COMMUNITY): Payer: Self-pay

## 2022-07-07 MED ORDER — NORETHINDRONE ACET-ETHINYL EST 1-20 MG-MCG PO TABS
1.0000 | ORAL_TABLET | Freq: Every day | ORAL | 4 refills | Status: DC
  Filled 2022-07-07: qty 21, 21d supply, fill #0
  Filled 2022-07-07: qty 84, 84d supply, fill #0

## 2022-07-24 ENCOUNTER — Other Ambulatory Visit (HOSPITAL_COMMUNITY): Payer: Self-pay

## 2022-08-11 ENCOUNTER — Other Ambulatory Visit (HOSPITAL_COMMUNITY): Payer: Self-pay

## 2022-09-10 DIAGNOSIS — E559 Vitamin D deficiency, unspecified: Secondary | ICD-10-CM | POA: Diagnosis not present

## 2022-09-10 DIAGNOSIS — I10 Essential (primary) hypertension: Secondary | ICD-10-CM | POA: Diagnosis not present

## 2022-09-10 DIAGNOSIS — E0590 Thyrotoxicosis, unspecified without thyrotoxic crisis or storm: Secondary | ICD-10-CM | POA: Diagnosis not present

## 2022-09-15 DIAGNOSIS — E559 Vitamin D deficiency, unspecified: Secondary | ICD-10-CM | POA: Diagnosis not present

## 2022-09-15 DIAGNOSIS — J309 Allergic rhinitis, unspecified: Secondary | ICD-10-CM | POA: Diagnosis not present

## 2022-09-15 DIAGNOSIS — Z6821 Body mass index (BMI) 21.0-21.9, adult: Secondary | ICD-10-CM | POA: Diagnosis not present

## 2022-09-15 DIAGNOSIS — I10 Essential (primary) hypertension: Secondary | ICD-10-CM | POA: Diagnosis not present

## 2022-09-15 DIAGNOSIS — Z23 Encounter for immunization: Secondary | ICD-10-CM | POA: Diagnosis not present

## 2022-09-15 DIAGNOSIS — Z86010 Personal history of colonic polyps: Secondary | ICD-10-CM | POA: Diagnosis not present

## 2022-09-15 DIAGNOSIS — Z1231 Encounter for screening mammogram for malignant neoplasm of breast: Secondary | ICD-10-CM | POA: Diagnosis not present

## 2022-09-15 DIAGNOSIS — Z0000 Encounter for general adult medical examination without abnormal findings: Secondary | ICD-10-CM | POA: Diagnosis not present

## 2022-09-15 DIAGNOSIS — N951 Menopausal and female climacteric states: Secondary | ICD-10-CM | POA: Diagnosis not present

## 2022-09-15 DIAGNOSIS — E0590 Thyrotoxicosis, unspecified without thyrotoxic crisis or storm: Secondary | ICD-10-CM | POA: Diagnosis not present

## 2022-09-20 ENCOUNTER — Other Ambulatory Visit (HOSPITAL_COMMUNITY): Payer: Self-pay

## 2022-09-20 MED ORDER — METOPROLOL SUCCINATE ER 25 MG PO TB24
25.0000 mg | ORAL_TABLET | Freq: Every day | ORAL | 3 refills | Status: DC
  Filled 2022-09-20 – 2022-10-22 (×2): qty 90, 90d supply, fill #0
  Filled 2023-02-11: qty 90, 90d supply, fill #1
  Filled 2023-05-02 (×2): qty 90, 90d supply, fill #2
  Filled 2023-08-05: qty 90, 90d supply, fill #3

## 2022-09-20 MED ORDER — NORETHINDRONE ACET-ETHINYL EST 1-20 MG-MCG PO TABS
1.0000 | ORAL_TABLET | ORAL | 4 refills | Status: DC
  Filled 2022-09-20 – 2022-09-30 (×2): qty 84, 84d supply, fill #0
  Filled 2022-12-23: qty 84, 84d supply, fill #1
  Filled 2023-03-13: qty 84, 84d supply, fill #2
  Filled 2023-06-19: qty 84, 84d supply, fill #3
  Filled 2023-09-10: qty 84, 84d supply, fill #4

## 2022-09-20 MED ORDER — HYDROCHLOROTHIAZIDE 25 MG PO TABS
25.0000 mg | ORAL_TABLET | Freq: Every day | ORAL | 3 refills | Status: DC
  Filled 2022-09-20 – 2022-10-25 (×2): qty 90, 90d supply, fill #0
  Filled 2023-01-23: qty 90, 90d supply, fill #1
  Filled 2023-05-02: qty 90, 90d supply, fill #2
  Filled 2023-08-05: qty 90, 90d supply, fill #3

## 2022-09-29 ENCOUNTER — Other Ambulatory Visit (HOSPITAL_COMMUNITY): Payer: Self-pay

## 2022-09-30 ENCOUNTER — Other Ambulatory Visit (HOSPITAL_COMMUNITY): Payer: Self-pay

## 2022-10-22 ENCOUNTER — Other Ambulatory Visit (HOSPITAL_COMMUNITY): Payer: Self-pay

## 2022-10-25 ENCOUNTER — Other Ambulatory Visit (HOSPITAL_COMMUNITY): Payer: Self-pay

## 2022-12-23 ENCOUNTER — Other Ambulatory Visit (HOSPITAL_COMMUNITY): Payer: Self-pay

## 2023-01-13 ENCOUNTER — Other Ambulatory Visit: Payer: Self-pay | Admitting: Family Medicine

## 2023-01-13 ENCOUNTER — Other Ambulatory Visit (HOSPITAL_COMMUNITY): Payer: Self-pay

## 2023-01-13 DIAGNOSIS — Z1231 Encounter for screening mammogram for malignant neoplasm of breast: Secondary | ICD-10-CM

## 2023-01-16 ENCOUNTER — Ambulatory Visit
Admission: RE | Admit: 2023-01-16 | Discharge: 2023-01-16 | Disposition: A | Discharge status: Home / Self Care | Payer: Commercial Managed Care - PPO | Source: Ambulatory Visit | Attending: Family Medicine | Admitting: Family Medicine

## 2023-01-16 DIAGNOSIS — Z1231 Encounter for screening mammogram for malignant neoplasm of breast: Secondary | ICD-10-CM | POA: Diagnosis not present

## 2023-01-16 DIAGNOSIS — Z23 Encounter for immunization: Secondary | ICD-10-CM | POA: Diagnosis not present

## 2023-01-21 ENCOUNTER — Other Ambulatory Visit (HOSPITAL_COMMUNITY): Payer: Self-pay

## 2023-01-23 ENCOUNTER — Other Ambulatory Visit (HOSPITAL_COMMUNITY): Payer: Self-pay

## 2023-02-11 ENCOUNTER — Other Ambulatory Visit (HOSPITAL_COMMUNITY): Payer: Self-pay

## 2023-02-12 ENCOUNTER — Other Ambulatory Visit (HOSPITAL_COMMUNITY): Payer: Self-pay

## 2023-03-13 ENCOUNTER — Other Ambulatory Visit (HOSPITAL_COMMUNITY): Payer: Self-pay

## 2023-03-14 ENCOUNTER — Other Ambulatory Visit (HOSPITAL_COMMUNITY): Payer: Self-pay

## 2023-05-02 ENCOUNTER — Other Ambulatory Visit (HOSPITAL_COMMUNITY): Payer: Self-pay

## 2023-05-08 ENCOUNTER — Other Ambulatory Visit (HOSPITAL_COMMUNITY): Payer: Self-pay

## 2023-06-19 ENCOUNTER — Other Ambulatory Visit (HOSPITAL_COMMUNITY): Payer: Self-pay

## 2023-07-24 DIAGNOSIS — Z23 Encounter for immunization: Secondary | ICD-10-CM | POA: Diagnosis not present

## 2023-08-05 ENCOUNTER — Other Ambulatory Visit (HOSPITAL_COMMUNITY): Payer: Self-pay

## 2023-08-25 DIAGNOSIS — Z13220 Encounter for screening for lipoid disorders: Secondary | ICD-10-CM | POA: Diagnosis not present

## 2023-08-25 DIAGNOSIS — R002 Palpitations: Secondary | ICD-10-CM | POA: Diagnosis not present

## 2023-08-25 DIAGNOSIS — Z6822 Body mass index (BMI) 22.0-22.9, adult: Secondary | ICD-10-CM | POA: Diagnosis not present

## 2023-08-25 DIAGNOSIS — E0590 Thyrotoxicosis, unspecified without thyrotoxic crisis or storm: Secondary | ICD-10-CM | POA: Diagnosis not present

## 2023-08-25 DIAGNOSIS — I340 Nonrheumatic mitral (valve) insufficiency: Secondary | ICD-10-CM | POA: Diagnosis not present

## 2023-08-25 DIAGNOSIS — I10 Essential (primary) hypertension: Secondary | ICD-10-CM | POA: Diagnosis not present

## 2023-08-31 ENCOUNTER — Encounter (HOSPITAL_COMMUNITY): Payer: Self-pay | Admitting: Family Medicine

## 2023-08-31 ENCOUNTER — Other Ambulatory Visit (HOSPITAL_COMMUNITY): Payer: Self-pay | Admitting: Family Medicine

## 2023-08-31 DIAGNOSIS — I340 Nonrheumatic mitral (valve) insufficiency: Secondary | ICD-10-CM

## 2023-08-31 DIAGNOSIS — R002 Palpitations: Secondary | ICD-10-CM

## 2023-09-10 ENCOUNTER — Other Ambulatory Visit (HOSPITAL_COMMUNITY): Payer: Self-pay

## 2023-09-11 ENCOUNTER — Ambulatory Visit (HOSPITAL_COMMUNITY)

## 2023-09-11 ENCOUNTER — Encounter (HOSPITAL_COMMUNITY): Payer: Self-pay

## 2023-09-15 DIAGNOSIS — R002 Palpitations: Secondary | ICD-10-CM | POA: Diagnosis not present

## 2023-09-18 DIAGNOSIS — H5213 Myopia, bilateral: Secondary | ICD-10-CM | POA: Diagnosis not present

## 2023-09-18 DIAGNOSIS — H524 Presbyopia: Secondary | ICD-10-CM | POA: Diagnosis not present

## 2023-09-18 DIAGNOSIS — H16223 Keratoconjunctivitis sicca, not specified as Sjogren's, bilateral: Secondary | ICD-10-CM | POA: Diagnosis not present

## 2023-09-18 DIAGNOSIS — H52223 Regular astigmatism, bilateral: Secondary | ICD-10-CM | POA: Diagnosis not present

## 2023-09-21 DIAGNOSIS — E0590 Thyrotoxicosis, unspecified without thyrotoxic crisis or storm: Secondary | ICD-10-CM | POA: Diagnosis not present

## 2023-09-21 DIAGNOSIS — Z1389 Encounter for screening for other disorder: Secondary | ICD-10-CM | POA: Diagnosis not present

## 2023-09-21 DIAGNOSIS — J309 Allergic rhinitis, unspecified: Secondary | ICD-10-CM | POA: Diagnosis not present

## 2023-09-21 DIAGNOSIS — N951 Menopausal and female climacteric states: Secondary | ICD-10-CM | POA: Diagnosis not present

## 2023-09-21 DIAGNOSIS — Z860100 Personal history of colon polyps, unspecified: Secondary | ICD-10-CM | POA: Diagnosis not present

## 2023-09-21 DIAGNOSIS — Z0000 Encounter for general adult medical examination without abnormal findings: Secondary | ICD-10-CM | POA: Diagnosis not present

## 2023-09-21 DIAGNOSIS — R232 Flushing: Secondary | ICD-10-CM | POA: Diagnosis not present

## 2023-09-21 DIAGNOSIS — R002 Palpitations: Secondary | ICD-10-CM | POA: Diagnosis not present

## 2023-09-21 DIAGNOSIS — I10 Essential (primary) hypertension: Secondary | ICD-10-CM | POA: Diagnosis not present

## 2023-09-21 DIAGNOSIS — Z6823 Body mass index (BMI) 23.0-23.9, adult: Secondary | ICD-10-CM | POA: Diagnosis not present

## 2023-09-30 DIAGNOSIS — R002 Palpitations: Secondary | ICD-10-CM | POA: Diagnosis not present

## 2023-09-30 DIAGNOSIS — I340 Nonrheumatic mitral (valve) insufficiency: Secondary | ICD-10-CM | POA: Diagnosis not present

## 2023-10-02 ENCOUNTER — Other Ambulatory Visit (HOSPITAL_COMMUNITY): Payer: Self-pay

## 2023-10-02 MED ORDER — METOPROLOL SUCCINATE ER 50 MG PO TB24
50.0000 mg | ORAL_TABLET | Freq: Every day | ORAL | 3 refills | Status: DC
  Filled 2023-10-02 – 2023-10-03 (×2): qty 90, 90d supply, fill #0

## 2023-10-03 ENCOUNTER — Other Ambulatory Visit (HOSPITAL_COMMUNITY): Payer: Self-pay

## 2023-10-09 ENCOUNTER — Other Ambulatory Visit (HOSPITAL_COMMUNITY): Payer: Self-pay

## 2023-10-09 ENCOUNTER — Encounter: Payer: Self-pay | Admitting: Cardiology

## 2023-10-09 ENCOUNTER — Ambulatory Visit: Attending: Cardiology | Admitting: Cardiology

## 2023-10-09 VITALS — BP 132/86 | HR 93 | Ht 65.0 in | Wt 143.2 lb

## 2023-10-09 DIAGNOSIS — R9431 Abnormal electrocardiogram [ECG] [EKG]: Secondary | ICD-10-CM | POA: Insufficient documentation

## 2023-10-09 DIAGNOSIS — I471 Supraventricular tachycardia, unspecified: Secondary | ICD-10-CM | POA: Diagnosis not present

## 2023-10-09 MED ORDER — DILTIAZEM HCL ER COATED BEADS 120 MG PO CP24
120.0000 mg | ORAL_CAPSULE | Freq: Every day | ORAL | 3 refills | Status: DC
  Filled 2023-10-09: qty 90, 90d supply, fill #0

## 2023-10-09 NOTE — Progress Notes (Signed)
 Cardiology Office Note:  .   Date:  10/09/2023  ID:  Sandra Kim, DOB [birth year], MRN [id number] PCP: Helyn Lobstein, MD  [facility] Providers Cardiologist:  Fransico Ivy, MD PCP: Helyn Lobstein, MD  Chief Complaint  Patient presents with   Palpitations   Establish Care     Sandra Kim is a 53 y.o. female with palpitations  Discussed the use of AI scribe software for clinical note transcription with the patient, who gave verbal consent to proceed.  History of Present Illness Sandra Kim is a 53 year old female who presents with palpitations   She experiences frequent palpitations, described as a pounding sensation in her chest and sometimes audible in her ears. Episodes occur several times a day, with the longest lasting about four minutes as recorded by a monitor. She is on metoprolol , recently increased from 25 mg to 50 mg, but is uncertain of its effectiveness.  Her family history includes her mother having heart failure, possibly related to chemotherapy, with a notable heart episode during her high school years.  She maintains moderate physical activity, including walking at work and exercising three days a week with weightlifting and treadmill use. She occasionally feels winded when climbing stairs after being away from her routine. She does not consume alcohol and limits caffeine intake, opting for mushroom coffee and soda occasionally. She has nasal allergies and uses Claritin intermittently for relief.      Vitals:   10/09/23 0940  BP: 132/86  Pulse: 93  SpO2: 99%      Review of Systems  Cardiovascular:  Positive for palpitations. Negative for chest pain, dyspnea on exertion, leg swelling and syncope.        Studies Reviewed: Sandra Kim        EKG 10/09/2023: Normal sinus rhythm Normal ECG When compared with ECG of 11-Mar-2012 19:49, No significant change was found  Independently interpreted 08/2023: Chol 156, TG 78, HDL 51, LDL 90 K  3.8 TSH 1.7  Personally reviewed and independently interpreted (Ordered by PCP): Zio patch monitor 14 days 08/25/2023 - 09/08/2023: Dominant rhythm: Sinus. HR 73-160 bpm. Avg HR 100 bpm. 58 episodes of SVT, fastest at 194 bpm for 5 beats, longest for 2 min 43 sec at 110 bpm. <1% isolated SVE, couplet/triplets. 0 episodes of VT. <1% isolated VE, couplets. No atrial fibrillation/atrial flutter/VT/high grade AV block, sinus pause >3sec noted. 11 patient triggered events. Correlated with SVT.   Echocardiogram 09/2023: Normal EF No significant valvular abnormality   Physical Exam Vitals and nursing note reviewed.  Constitutional:      General: She is not in acute distress. Neck:     Vascular: No JVD.  Cardiovascular:     Rate and Rhythm: Normal rate and regular rhythm.     Heart sounds: Normal heart sounds. No murmur heard. Pulmonary:     Effort: Pulmonary effort is normal.     Breath sounds: Normal breath sounds. No wheezing or rales.  Musculoskeletal:     Right lower leg: No edema.     Left lower leg: No edema.      VISIT DIAGNOSES:   ICD-10-CM   1. Abnormal EKG  R94.31 EKG 12-Lead    2. SVT (supraventricular tachycardia) (HCC)  I47.10 EKG 12-Lead    Ambulatory referral to Cardiac Electrophysiology       Sandra Kim is a 53 y.o. female with SVT  Assessment & Plan  Palpitations:  Episodes of SVT noted on Zio monitor, symptomatic. Structurally  normal heart. Discussed vagal maneuvers. Continue metoprolol  succinate 50 mg daily, recently increased from 25 mg daily.  Additionally, also started diltiazem  CD 120 mg daily. 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 In addition, I will also refer her to EP to consider ablation, especially if medical management fails.    Meds ordered this encounter  Medications   diltiazem  (CARDIZEM  CD) 120 MG 24 hr capsule    Sig: Take 1 capsule (120 mg total) by mouth daily.    Dispense:  90 capsule     Refill:  3     F/u in 6 months  Signed, Cody Das, MD

## 2023-10-09 NOTE — Patient Instructions (Signed)
 Medication Instructions:  START Diltiazem  CD 120 mg take one tablet daily   *If you need a refill on your cardiac medications before your next appointment, please call your pharmacy*   REFERRAL TO EP  Follow-Up: At [facility], you and your health needs are our priority.  As part of our continuing mission to provide you with exceptional heart care, our providers are all part of one team.  This team includes your primary Cardiologist (physician) and Advanced Practice Providers or APPs (Physician Assistants and Nurse Practitioners) who all work together to provide you with the care you need, when you need it.  Your next appointment:   6 month(s)  Provider:   Cody Das, MD

## 2023-10-12 ENCOUNTER — Other Ambulatory Visit (HOSPITAL_COMMUNITY): Payer: Self-pay

## 2023-10-12 DIAGNOSIS — Z6823 Body mass index (BMI) 23.0-23.9, adult: Secondary | ICD-10-CM | POA: Diagnosis not present

## 2023-10-12 DIAGNOSIS — M7551 Bursitis of right shoulder: Secondary | ICD-10-CM | POA: Diagnosis not present

## 2023-10-12 MED ORDER — MELOXICAM 15 MG PO TABS
15.0000 mg | ORAL_TABLET | Freq: Every day | ORAL | 0 refills | Status: AC
  Filled 2023-10-12: qty 30, 30d supply, fill #0

## 2023-10-22 NOTE — Progress Notes (Unsigned)
  Electrophysiology Office Note:    Date:  10/23/2023   ID:  Sandra Kim, DOB 1970-11-30, MRN [id number]  [facility] Cardiologist:  Cody Das, MD  [facility] Electrophysiologist:  Boyce Byes, MD   Referring MD: Cody Das, MD   Chief Complaint: Abnormal heart monitor  History of Present Illness:    Sandra Kim is a 53 year old woman who I am seeing today for an evaluation of palpitations at the request of Dr. Filiberto Kim.  The patient Sandra Kim Oct 09, 2023.  At that appointment she reported intermittent palpitations that she feels in her ears.  They occur several times a day.  She takes metoprolol .  She is active and exercises multiple times per week.  A ZIO monitor ordered by her primary care physician in March of this year showed 58 episodes of SVT, longest episode lasting 2 minutes 43 seconds with an average heart rate of 110 bpm.  There was no atrial fibrillation.  She is referred to discuss medical management versus catheter-based management of her arrhythmia.  Today she is with her husband in clinic.  She is very active.  She exercises regularly.  No problems when she exercises.  When she is at work she can feel episodes of strong heartbeats that last for 1 to 2 minutes.  No syncope or presyncope.  She notices a changes in her breathing but not a shortness of breath.     Their past medical, social and family history was reviewed.   ROS:   Please see the history of present illness.    All other systems reviewed and are negative.  EKGs/Labs/Other Studies Reviewed:    The following studies were reviewed today:  Oct 09, 2023 EKG shows sinus rhythm.  No preexcitation.  March 2025 ZIO monitor personally reviewed (media tab) Heart rate 73-1 94, average 100 bpm Rare supraventricular and ventricular ectopy No atrial fibrillation SVT episodes, longest less than 3 minutes.  Rhythm strip suggestive of atrial tachycardia.          Physical Exam:    VS:  BP 126/82   Pulse 80   Ht 5\' 5"  (1.651 m)   Wt 140 lb (63.5 kg)   SpO2 97%   BMI 23.30 kg/m     Wt Readings from Last 3 Encounters:  10/23/23 140 lb (63.5 kg)  10/09/23 143 lb 3.2 oz (65 kg)  11/15/18 114 lb (51.7 kg)     GEN: no distress CARD: RRR, No MRG RESP: No IWOB. CTAB.        ASSESSMENT AND PLAN:    1. SVT (supraventricular tachycardia) (HCC)     #Palpitations #Atrial tachycardia The patient has palpitations and a heart monitor that showed evidence of salvos of atrial tachycardia. I do not see evidence of atrial fibrillation  At this time, given the likely diagnosis of atrial tachycardia, I have recommended medical management with nodal blockers.  If she were to continue have episodes of SVT, consider class Ic agent such as flecainide.  Given lack of improvement with metoprolol , will stop this medication today and increase her diltiazem  to 180 mg by mouth once daily  Follow-up with EP as needed.    Signed, Leanora Prophet. Marven Slimmer, MD, [facility], [facility] 10/23/2023 10:48 AM    Electrophysiology [facility]

## 2023-10-23 ENCOUNTER — Other Ambulatory Visit (HOSPITAL_COMMUNITY): Payer: Self-pay

## 2023-10-23 ENCOUNTER — Ambulatory Visit: Attending: Cardiology | Admitting: Cardiology

## 2023-10-23 VITALS — BP 126/82 | HR 80 | Ht 65.0 in | Wt 140.0 lb

## 2023-10-23 DIAGNOSIS — I471 Supraventricular tachycardia, unspecified: Secondary | ICD-10-CM | POA: Diagnosis not present

## 2023-10-23 MED ORDER — DILTIAZEM HCL ER COATED BEADS 180 MG PO CP24
180.0000 mg | ORAL_CAPSULE | Freq: Every day | ORAL | 3 refills | Status: DC
  Filled 2023-10-23: qty 90, 90d supply, fill #0
  Filled 2024-01-26: qty 90, 90d supply, fill #1
  Filled 2024-04-20: qty 90, 90d supply, fill #2

## 2023-10-23 NOTE — Patient Instructions (Signed)
 Medication Instructions:  Your physician has recommended you make the following change in your medication:  1) STOP taking metoprolol   2) INCREASE diltiazem  to 180 mg daily *If you need a refill on your cardiac medications before your next appointment, please call your pharmacy*  Follow-Up: At [facility], you and your health needs are our priority.  As part of our continuing mission to provide you with exceptional heart care, our providers are all part of one team.  This team includes your primary Cardiologist (physician) and Advanced Practice Providers or APPs (Physician Assistants and Nurse Practitioners) who all work together to provide you with the care you need, when you need it.  Your next appointment:   As needed with EP

## 2023-11-10 ENCOUNTER — Other Ambulatory Visit (HOSPITAL_COMMUNITY): Payer: Self-pay

## 2023-11-11 ENCOUNTER — Other Ambulatory Visit (HOSPITAL_COMMUNITY): Payer: Self-pay

## 2023-11-11 MED ORDER — HYDROCHLOROTHIAZIDE 25 MG PO TABS
25.0000 mg | ORAL_TABLET | Freq: Every morning | ORAL | 1 refills | Status: DC
  Filled 2023-11-11: qty 90, 90d supply, fill #0
  Filled 2024-02-04: qty 90, 90d supply, fill #1

## 2023-11-20 ENCOUNTER — Other Ambulatory Visit (HOSPITAL_COMMUNITY): Payer: Self-pay

## 2023-11-20 MED ORDER — CELECOXIB 100 MG PO CAPS
100.0000 mg | ORAL_CAPSULE | Freq: Two times a day (BID) | ORAL | 1 refills | Status: AC
  Filled 2023-11-20: qty 60, 30d supply, fill #0

## 2023-11-21 ENCOUNTER — Other Ambulatory Visit (HOSPITAL_COMMUNITY): Payer: Self-pay

## 2023-11-24 ENCOUNTER — Other Ambulatory Visit (HOSPITAL_COMMUNITY): Payer: Self-pay

## 2023-11-25 ENCOUNTER — Other Ambulatory Visit (HOSPITAL_BASED_OUTPATIENT_CLINIC_OR_DEPARTMENT_OTHER): Payer: Self-pay

## 2023-11-25 ENCOUNTER — Other Ambulatory Visit (HOSPITAL_COMMUNITY): Payer: Self-pay

## 2023-11-25 MED ORDER — NORETHINDRONE ACET-ETHINYL EST 1-20 MG-MCG PO TABS
1.0000 | ORAL_TABLET | Freq: Every day | ORAL | 4 refills | Status: AC
  Filled 2023-11-25: qty 84, 84d supply, fill #0
  Filled 2024-02-04: qty 84, 84d supply, fill #1
  Filled 2024-05-10: qty 84, 84d supply, fill #2

## 2023-12-03 DIAGNOSIS — M25511 Pain in right shoulder: Secondary | ICD-10-CM | POA: Diagnosis not present

## 2024-01-26 ENCOUNTER — Other Ambulatory Visit (HOSPITAL_COMMUNITY): Payer: Self-pay

## 2024-01-28 DIAGNOSIS — M25511 Pain in right shoulder: Secondary | ICD-10-CM | POA: Diagnosis not present

## 2024-01-28 DIAGNOSIS — M25562 Pain in left knee: Secondary | ICD-10-CM | POA: Diagnosis not present

## 2024-02-04 ENCOUNTER — Other Ambulatory Visit (HOSPITAL_COMMUNITY): Payer: Self-pay

## 2024-03-08 DIAGNOSIS — M25511 Pain in right shoulder: Secondary | ICD-10-CM | POA: Diagnosis not present

## 2024-03-08 DIAGNOSIS — M25562 Pain in left knee: Secondary | ICD-10-CM | POA: Diagnosis not present

## 2024-03-17 DIAGNOSIS — M6281 Muscle weakness (generalized): Secondary | ICD-10-CM | POA: Diagnosis not present

## 2024-03-17 DIAGNOSIS — R293 Abnormal posture: Secondary | ICD-10-CM | POA: Diagnosis not present

## 2024-03-17 DIAGNOSIS — M25511 Pain in right shoulder: Secondary | ICD-10-CM | POA: Diagnosis not present

## 2024-03-17 DIAGNOSIS — M25611 Stiffness of right shoulder, not elsewhere classified: Secondary | ICD-10-CM | POA: Diagnosis not present

## 2024-03-23 DIAGNOSIS — M25511 Pain in right shoulder: Secondary | ICD-10-CM | POA: Diagnosis not present

## 2024-03-23 DIAGNOSIS — R293 Abnormal posture: Secondary | ICD-10-CM | POA: Diagnosis not present

## 2024-03-23 DIAGNOSIS — M25611 Stiffness of right shoulder, not elsewhere classified: Secondary | ICD-10-CM | POA: Diagnosis not present

## 2024-03-23 DIAGNOSIS — M6281 Muscle weakness (generalized): Secondary | ICD-10-CM | POA: Diagnosis not present

## 2024-03-28 ENCOUNTER — Other Ambulatory Visit: Payer: Self-pay | Admitting: Family Medicine

## 2024-03-28 DIAGNOSIS — Z1231 Encounter for screening mammogram for malignant neoplasm of breast: Secondary | ICD-10-CM

## 2024-03-29 ENCOUNTER — Inpatient Hospital Stay: Admission: RE | Admit: 2024-03-29 | Discharge: 2024-03-29 | Attending: Family Medicine | Admitting: Family Medicine

## 2024-03-29 DIAGNOSIS — Z1231 Encounter for screening mammogram for malignant neoplasm of breast: Secondary | ICD-10-CM

## 2024-03-30 DIAGNOSIS — M25611 Stiffness of right shoulder, not elsewhere classified: Secondary | ICD-10-CM | POA: Diagnosis not present

## 2024-03-30 DIAGNOSIS — R293 Abnormal posture: Secondary | ICD-10-CM | POA: Diagnosis not present

## 2024-03-30 DIAGNOSIS — M6281 Muscle weakness (generalized): Secondary | ICD-10-CM | POA: Diagnosis not present

## 2024-03-30 DIAGNOSIS — M25511 Pain in right shoulder: Secondary | ICD-10-CM | POA: Diagnosis not present

## 2024-04-06 DIAGNOSIS — M6281 Muscle weakness (generalized): Secondary | ICD-10-CM | POA: Diagnosis not present

## 2024-04-06 DIAGNOSIS — R293 Abnormal posture: Secondary | ICD-10-CM | POA: Diagnosis not present

## 2024-04-06 DIAGNOSIS — M25511 Pain in right shoulder: Secondary | ICD-10-CM | POA: Diagnosis not present

## 2024-04-06 DIAGNOSIS — M25611 Stiffness of right shoulder, not elsewhere classified: Secondary | ICD-10-CM | POA: Diagnosis not present

## 2024-04-11 DIAGNOSIS — R293 Abnormal posture: Secondary | ICD-10-CM | POA: Diagnosis not present

## 2024-04-11 DIAGNOSIS — M6281 Muscle weakness (generalized): Secondary | ICD-10-CM | POA: Diagnosis not present

## 2024-04-11 DIAGNOSIS — M25511 Pain in right shoulder: Secondary | ICD-10-CM | POA: Diagnosis not present

## 2024-04-11 DIAGNOSIS — M25611 Stiffness of right shoulder, not elsewhere classified: Secondary | ICD-10-CM | POA: Diagnosis not present

## 2024-04-18 DIAGNOSIS — M25611 Stiffness of right shoulder, not elsewhere classified: Secondary | ICD-10-CM | POA: Diagnosis not present

## 2024-04-18 DIAGNOSIS — M6281 Muscle weakness (generalized): Secondary | ICD-10-CM | POA: Diagnosis not present

## 2024-04-18 DIAGNOSIS — R293 Abnormal posture: Secondary | ICD-10-CM | POA: Diagnosis not present

## 2024-04-18 DIAGNOSIS — M25511 Pain in right shoulder: Secondary | ICD-10-CM | POA: Diagnosis not present

## 2024-04-21 ENCOUNTER — Other Ambulatory Visit: Payer: Self-pay

## 2024-04-21 ENCOUNTER — Other Ambulatory Visit (HOSPITAL_COMMUNITY): Payer: Self-pay

## 2024-04-21 DIAGNOSIS — I10 Essential (primary) hypertension: Secondary | ICD-10-CM | POA: Diagnosis not present

## 2024-04-21 DIAGNOSIS — J309 Allergic rhinitis, unspecified: Secondary | ICD-10-CM | POA: Diagnosis not present

## 2024-04-21 DIAGNOSIS — N951 Menopausal and female climacteric states: Secondary | ICD-10-CM | POA: Diagnosis not present

## 2024-04-21 DIAGNOSIS — R002 Palpitations: Secondary | ICD-10-CM | POA: Diagnosis not present

## 2024-04-21 DIAGNOSIS — E0590 Thyrotoxicosis, unspecified without thyrotoxic crisis or storm: Secondary | ICD-10-CM | POA: Diagnosis not present

## 2024-04-21 DIAGNOSIS — N644 Mastodynia: Secondary | ICD-10-CM | POA: Diagnosis not present

## 2024-04-21 DIAGNOSIS — Z6822 Body mass index (BMI) 22.0-22.9, adult: Secondary | ICD-10-CM | POA: Diagnosis not present

## 2024-04-21 MED ORDER — HYDROCHLOROTHIAZIDE 25 MG PO TABS
25.0000 mg | ORAL_TABLET | Freq: Every day | ORAL | 1 refills | Status: AC
  Filled 2024-04-21: qty 90, 90d supply, fill #0

## 2024-05-03 DIAGNOSIS — M25562 Pain in left knee: Secondary | ICD-10-CM | POA: Diagnosis not present

## 2024-05-03 DIAGNOSIS — M25511 Pain in right shoulder: Secondary | ICD-10-CM | POA: Diagnosis not present

## 2024-05-11 ENCOUNTER — Other Ambulatory Visit (HOSPITAL_COMMUNITY): Payer: Self-pay

## 2024-05-11 ENCOUNTER — Ambulatory Visit: Attending: Cardiology | Admitting: Cardiology

## 2024-05-11 ENCOUNTER — Other Ambulatory Visit: Payer: Self-pay

## 2024-05-11 ENCOUNTER — Encounter: Payer: Self-pay | Admitting: Cardiology

## 2024-05-11 VITALS — BP 134/88 | HR 102 | Ht 65.0 in | Wt 139.0 lb

## 2024-05-11 DIAGNOSIS — I471 Supraventricular tachycardia, unspecified: Secondary | ICD-10-CM | POA: Insufficient documentation

## 2024-05-11 MED ORDER — DILTIAZEM HCL ER COATED BEADS 240 MG PO CP24
240.0000 mg | ORAL_CAPSULE | Freq: Every day | ORAL | 3 refills | Status: AC
  Filled 2024-05-11: qty 90, 90d supply, fill #0

## 2024-05-11 NOTE — Progress Notes (Signed)
  Cardiology Office Note:  .   Date:  05/11/2024  ID:  Sandra Kim, DOB [birth year], MRN [id number] PCP: Aisha Harvey, MD  [facility] Providers Cardiologist:  Newman Lawrence, MD PCP: Aisha Harvey, MD  Chief Complaint  Patient presents with   SVT     Sandra Kim is a 53 y.o. female with palpitations, atrial tachycardia/SVT  History of Present Illness Patient was evaluated by EP Dr. Cindie in 10/2023.  Metoprolol  was stopped and diltiazem  was increased to 180 mg daily, with option of adding flecainide in future if symptoms are not controlled.  Her symptoms of palpitations are significantly improved, but still occur occasionally.  Blood pressure is elevated today.  Separately, she is also on estrogen therapy for perimenopausal symptoms.      Vitals:   05/11/24 0915  BP: 134/88  Pulse: (!) 102  SpO2: 96%       Review of Systems  Cardiovascular:  Positive for palpitations. Negative for chest pain, dyspnea on exertion, leg swelling and syncope.        Studies Reviewed: SABRA        EKG 10/09/2023: Normal sinus rhythm Normal ECG When compared with ECG of 11-Mar-2012 19:49, No significant change was found  Independently interpreted 08/2023: Chol 156, TG 78, HDL 51, LDL 90 K 3.8 TSH 1.7  Personally reviewed and independently interpreted (Ordered by PCP): Zio patch monitor 14 days 08/25/2023 - 09/08/2023: Dominant rhythm: Sinus. HR 73-160 bpm. Avg HR 100 bpm. 58 episodes of SVT, fastest at 194 bpm for 5 beats, longest for 2 min 43 sec at 110 bpm. <1% isolated SVE, couplet/triplets. 0 episodes of VT. <1% isolated VE, couplets. No atrial fibrillation/atrial flutter/VT/high grade AV block, sinus pause >3sec noted. 11 patient triggered events. Correlated with SVT.   Echocardiogram 09/2023: Normal EF No significant valvular abnormality   Physical Exam Vitals and nursing note reviewed.  Constitutional:      General: She is not in acute  distress. Neck:     Vascular: No JVD.  Cardiovascular:     Rate and Rhythm: Normal rate and regular rhythm.     Heart sounds: Normal heart sounds. No murmur heard. Pulmonary:     Effort: Pulmonary effort is normal.     Breath sounds: Normal breath sounds. No wheezing or rales.  Musculoskeletal:     Right lower leg: No edema.     Left lower leg: No edema.      VISIT DIAGNOSES:   ICD-10-CM   1. SVT (supraventricular tachycardia)  I47.10         Sandra Kim is a 53 y.o. female with  palpitations, atrial tachycardia/SVT  Assessment & Plan  Atrial tachycardia/SVT: Improved on diltiazem  180 mg daily. Given the residual symptoms and elevated blood pressure noted today, increase diltiazem  CD to 240 mg daily. In future, if she has worsening SVT on diltiazem , could consider referring back to EP for consideration for flecainide versus ablation    Meds ordered this encounter  Medications   diltiazem  (CARDIZEM  CD) 240 MG 24 hr capsule    Sig: Take 1 capsule (240 mg total) by mouth daily.    Dispense:  90 capsule    Refill:  3     F/u in 6 months  Signed, Newman JINNY Lawrence, MD

## 2024-05-11 NOTE — Patient Instructions (Signed)
 Medication Instructions:  INCREASE Diltiazem  CD to 240 mg daily   *If you need a refill on your cardiac medications before your next appointment, please call your pharmacy*  Follow-Up: At [facility], you and your health needs are our priority.  As part of our continuing mission to provide you with exceptional heart care, our providers are all part of one team.  This team includes your primary Cardiologist (physician) and Advanced Practice Providers or APPs (Physician Assistants and Nurse Practitioners) who all work together to provide you with the care you need, when you need it.  Your next appointment:   6 month(s)  Provider:   One of our Advanced Practice Providers (APPs): Morse Clause, PA-C  Lamarr Satterfield, NP Miriam Shams, NP  Olivia Pavy, PA-C Josefa Beauvais, NP  Leontine Salen, PA-C Orren Fabry, PA-C  [place], PA-C Ernest Dick, NP  Damien Braver, NP Jon Hails, PA-C  Waddell Donath, PA-C    Dayna Dunn, PA-C  Scott Weaver, PA-C Lum Louis, NP Katlyn West, NP Callie Goodrich, PA-C  Xika Zhao, NP Sheng Haley, PA-C    Kathleen Johnson, PA-C

## 2024-05-13 DIAGNOSIS — M25611 Stiffness of right shoulder, not elsewhere classified: Secondary | ICD-10-CM | POA: Diagnosis not present

## 2024-05-13 DIAGNOSIS — R293 Abnormal posture: Secondary | ICD-10-CM | POA: Diagnosis not present

## 2024-05-13 DIAGNOSIS — M25511 Pain in right shoulder: Secondary | ICD-10-CM | POA: Diagnosis not present

## 2024-05-13 DIAGNOSIS — M6281 Muscle weakness (generalized): Secondary | ICD-10-CM | POA: Diagnosis not present

## 2024-05-19 DIAGNOSIS — M25611 Stiffness of right shoulder, not elsewhere classified: Secondary | ICD-10-CM | POA: Diagnosis not present

## 2024-05-19 DIAGNOSIS — M25511 Pain in right shoulder: Secondary | ICD-10-CM | POA: Diagnosis not present

## 2024-05-19 DIAGNOSIS — M6281 Muscle weakness (generalized): Secondary | ICD-10-CM | POA: Diagnosis not present

## 2024-05-19 DIAGNOSIS — R293 Abnormal posture: Secondary | ICD-10-CM | POA: Diagnosis not present

## 2024-05-26 DIAGNOSIS — M25511 Pain in right shoulder: Secondary | ICD-10-CM | POA: Diagnosis not present

## 2024-05-26 DIAGNOSIS — M25611 Stiffness of right shoulder, not elsewhere classified: Secondary | ICD-10-CM | POA: Diagnosis not present

## 2024-05-26 DIAGNOSIS — M6281 Muscle weakness (generalized): Secondary | ICD-10-CM | POA: Diagnosis not present

## 2024-05-26 DIAGNOSIS — R293 Abnormal posture: Secondary | ICD-10-CM | POA: Diagnosis not present
